# Patient Record
Sex: Female | Born: 1990 | Race: Black or African American | Hispanic: No | Marital: Married | Smoking: Never smoker
Health system: Southern US, Community
[De-identification: ages and names within clinical notes are randomized; demographics above are authoritative.]

## PROBLEM LIST (undated history)

## (undated) DIAGNOSIS — D219 Benign neoplasm of connective and other soft tissue, unspecified: Secondary | ICD-10-CM

## (undated) DIAGNOSIS — I959 Hypotension, unspecified: Secondary | ICD-10-CM

## (undated) DIAGNOSIS — H9192 Unspecified hearing loss, left ear: Secondary | ICD-10-CM

## (undated) HISTORY — DX: Benign neoplasm of connective and other soft tissue, unspecified: D21.9

## (undated) HISTORY — PX: MYOMECTOMY: SHX85

## (undated) HISTORY — PX: CYSTECTOMY: SHX5119

## (undated) HISTORY — PX: OTHER SURGICAL HISTORY: SHX169

---
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
## 2008-04-28 ENCOUNTER — Inpatient Hospital Stay (HOSPITAL_COMMUNITY): Admission: AD | Admit: 2008-04-28 | Discharge: 2008-04-28 | Payer: Self-pay | Admitting: Obstetrics & Gynecology

## 2008-07-18 ENCOUNTER — Inpatient Hospital Stay (HOSPITAL_COMMUNITY): Admission: AD | Admit: 2008-07-18 | Discharge: 2008-07-18 | Payer: Self-pay | Admitting: Family Medicine

## 2008-11-22 ENCOUNTER — Emergency Department (HOSPITAL_COMMUNITY): Admission: EM | Admit: 2008-11-22 | Discharge: 2008-11-22 | Payer: Self-pay | Admitting: Emergency Medicine

## 2009-01-09 ENCOUNTER — Ambulatory Visit: Payer: Self-pay | Admitting: Physician Assistant

## 2009-01-09 ENCOUNTER — Inpatient Hospital Stay (HOSPITAL_COMMUNITY): Admission: AD | Admit: 2009-01-09 | Discharge: 2009-01-09 | Payer: Self-pay | Admitting: Obstetrics & Gynecology

## 2010-03-30 ENCOUNTER — Emergency Department (HOSPITAL_COMMUNITY): Admission: EM | Admit: 2010-03-30 | Discharge: 2010-03-30 | Payer: Self-pay | Admitting: Emergency Medicine

## 2010-05-31 ENCOUNTER — Emergency Department (HOSPITAL_COMMUNITY): Admission: EM | Admit: 2010-05-31 | Discharge: 2010-05-31 | Payer: Self-pay | Admitting: Family Medicine

## 2010-06-02 ENCOUNTER — Emergency Department (HOSPITAL_COMMUNITY): Admission: EM | Admit: 2010-06-02 | Discharge: 2010-06-02 | Payer: Self-pay | Admitting: Emergency Medicine

## 2010-10-21 LAB — URINALYSIS, ROUTINE W REFLEX MICROSCOPIC
Bilirubin Urine: NEGATIVE
Glucose, UA: NEGATIVE mg/dL
Ketones, ur: 15 mg/dL — AB

## 2010-10-21 LAB — URINE CULTURE
Colony Count: >=100000
Colony Count: NO GROWTH
Culture  Setup Time: 201110232358

## 2010-10-21 LAB — URINE MICROSCOPIC-ADD ON

## 2010-10-21 LAB — POCT I-STAT, CHEM 8
HCT: 39 % (ref 36.0–46.0)
Hemoglobin: 13.3 g/dL (ref 12.0–15.0)
Potassium: 3.9 mEq/L (ref 3.5–5.1)
Sodium: 139 mEq/L (ref 135–145)
TCO2: 24 mmol/L (ref 0–100)

## 2010-10-21 LAB — POCT URINALYSIS DIPSTICK
Glucose, UA: 100 mg/dL — AB
Ketones, ur: NEGATIVE mg/dL

## 2010-10-23 LAB — URINALYSIS, ROUTINE W REFLEX MICROSCOPIC
Bilirubin Urine: NEGATIVE
Hgb urine dipstick: NEGATIVE
Nitrite: NEGATIVE
Protein, ur: NEGATIVE mg/dL
Urobilinogen, UA: 1 mg/dL (ref 0.0–1.0)

## 2010-10-23 LAB — DIFFERENTIAL
Eosinophils Absolute: 0.3 10*3/uL (ref 0.0–0.7)
Eosinophils Relative: 3 % (ref 0–5)
Lymphocytes Relative: 18 % (ref 12–46)
Lymphs Abs: 1.6 10*3/uL (ref 0.7–4.0)
Neutrophils Relative %: 74 % (ref 43–77)

## 2010-10-23 LAB — POCT I-STAT, CHEM 8
BUN: 9 mg/dL (ref 6–23)
Calcium, Ion: 1.11 mmol/L — ABNORMAL LOW (ref 1.12–1.32)
Chloride: 105 mEq/L (ref 96–112)
Creatinine, Ser: 0.8 mg/dL (ref 0.4–1.2)
Hemoglobin: 14.3 g/dL (ref 12.0–15.0)
Sodium: 138 mEq/L (ref 135–145)

## 2010-10-23 LAB — CBC
HCT: 37.5 % (ref 36.0–46.0)
Hemoglobin: 13.3 g/dL (ref 12.0–15.0)
Platelets: 218 10*3/uL (ref 150–400)
RBC: 4.03 MIL/uL (ref 3.87–5.11)

## 2010-10-23 LAB — POCT PREGNANCY, URINE: Preg Test, Ur: NEGATIVE

## 2010-11-16 LAB — URINE MICROSCOPIC-ADD ON

## 2010-11-16 LAB — URINALYSIS, ROUTINE W REFLEX MICROSCOPIC
Protein, ur: NEGATIVE mg/dL
Urobilinogen, UA: 0.2 mg/dL (ref 0.0–1.0)

## 2010-11-16 LAB — GC/CHLAMYDIA PROBE AMP, GENITAL: Chlamydia, DNA Probe: NEGATIVE

## 2010-11-16 LAB — URINE CULTURE: Colony Count: 50000

## 2011-05-10 LAB — URINALYSIS, ROUTINE W REFLEX MICROSCOPIC
Ketones, ur: NEGATIVE
Protein, ur: NEGATIVE
Specific Gravity, Urine: 1.015
pH: 8

## 2011-05-10 LAB — URINE MICROSCOPIC-ADD ON

## 2011-05-10 LAB — RPR: RPR Ser Ql: NONREACTIVE

## 2011-05-10 LAB — WET PREP, GENITAL: Clue Cells Wet Prep HPF POC: NONE SEEN

## 2011-05-14 LAB — URINALYSIS, ROUTINE W REFLEX MICROSCOPIC
Ketones, ur: NEGATIVE mg/dL
Leukocytes, UA: NEGATIVE
Nitrite: NEGATIVE
Protein, ur: NEGATIVE mg/dL

## 2011-05-14 LAB — POCT PREGNANCY, URINE: Preg Test, Ur: NEGATIVE

## 2011-05-14 LAB — GC/CHLAMYDIA PROBE AMP, GENITAL: GC Probe Amp, Genital: NEGATIVE

## 2011-05-14 LAB — WET PREP, GENITAL
Trich, Wet Prep: NONE SEEN
Yeast Wet Prep HPF POC: NONE SEEN

## 2013-12-10 DIAGNOSIS — D259 Leiomyoma of uterus, unspecified: Secondary | ICD-10-CM | POA: Insufficient documentation

## 2013-12-10 HISTORY — DX: Leiomyoma of uterus, unspecified: D25.9

## 2013-12-23 ENCOUNTER — Encounter (HOSPITAL_COMMUNITY): Payer: Self-pay | Admitting: Emergency Medicine

## 2013-12-23 ENCOUNTER — Emergency Department (HOSPITAL_COMMUNITY): Payer: BC Managed Care – PPO

## 2013-12-23 ENCOUNTER — Emergency Department (HOSPITAL_COMMUNITY)
Admission: EM | Admit: 2013-12-23 | Discharge: 2013-12-23 | Disposition: A | Discharge status: Home / Self Care | Payer: BC Managed Care – PPO | Attending: Emergency Medicine | Admitting: Emergency Medicine

## 2013-12-23 DIAGNOSIS — Z880 Allergy status to penicillin: Secondary | ICD-10-CM | POA: Insufficient documentation

## 2013-12-23 DIAGNOSIS — O9989 Other specified diseases and conditions complicating pregnancy, childbirth and the puerperium: Secondary | ICD-10-CM | POA: Insufficient documentation

## 2013-12-23 DIAGNOSIS — R110 Nausea: Secondary | ICD-10-CM | POA: Insufficient documentation

## 2013-12-23 DIAGNOSIS — O039 Complete or unspecified spontaneous abortion without complication: Secondary | ICD-10-CM

## 2013-12-23 DIAGNOSIS — N939 Abnormal uterine and vaginal bleeding, unspecified: Secondary | ICD-10-CM

## 2013-12-23 LAB — COMPREHENSIVE METABOLIC PANEL WITH GFR
ALT: 13 U/L (ref 0–35)
AST: 22 U/L (ref 0–37)
Albumin: 4.1 g/dL (ref 3.5–5.2)
Alkaline Phosphatase: 59 U/L (ref 39–117)
BUN: 7 mg/dL (ref 6–23)
CO2: 23 meq/L (ref 19–32)
Calcium: 9.4 mg/dL (ref 8.4–10.5)
Chloride: 106 meq/L (ref 96–112)
Creatinine, Ser: 0.86 mg/dL (ref 0.50–1.10)
GFR calc Af Amer: >90 mL/min
GFR calc non Af Amer: >90 mL/min
Glucose, Bld: 91 mg/dL (ref 70–99)
Potassium: 4.2 meq/L (ref 3.7–5.3)
Sodium: 141 meq/L (ref 137–147)
Total Bilirubin: <0.2 mg/dL — ABNORMAL LOW (ref 0.3–1.2)
Total Protein: 7.3 g/dL (ref 6.0–8.3)

## 2013-12-23 LAB — POC URINE PREG, ED: Preg Test, Ur: POSITIVE — AB

## 2013-12-23 LAB — CBC WITH DIFFERENTIAL/PLATELET
BASOS ABS: 0 10*3/uL (ref 0.0–0.1)
Basophils Relative: 0 % (ref 0–1)
EOS ABS: 0.4 10*3/uL (ref 0.0–0.7)
EOS PCT: 4 % (ref 0–5)
HCT: 37.6 % (ref 36.0–46.0)
Hemoglobin: 12.7 g/dL (ref 12.0–15.0)
Lymphocytes Relative: 40 % (ref 12–46)
Lymphs Abs: 3.2 10*3/uL (ref 0.7–4.0)
MCH: 32.3 pg (ref 26.0–34.0)
MCHC: 33.8 g/dL (ref 30.0–36.0)
MCV: 95.7 fL (ref 78.0–100.0)
Monocytes Absolute: 0.5 10*3/uL (ref 0.1–1.0)
Monocytes Relative: 6 % (ref 3–12)
Neutro Abs: 4 10*3/uL (ref 1.7–7.7)
Neutrophils Relative %: 50 % (ref 43–77)
PLATELETS: 269 10*3/uL (ref 150–400)
RBC: 3.93 MIL/uL (ref 3.87–5.11)
RDW: 13.1 % (ref 11.5–15.5)
WBC: 8.1 10*3/uL (ref 4.0–10.5)

## 2013-12-23 LAB — URINALYSIS, ROUTINE W REFLEX MICROSCOPIC
Bilirubin Urine: NEGATIVE
Glucose, UA: NEGATIVE mg/dL
KETONES UR: NEGATIVE mg/dL
NITRITE: NEGATIVE
PROTEIN: 100 mg/dL — AB
Specific Gravity, Urine: 1.021 (ref 1.005–1.030)
UROBILINOGEN UA: 0.2 mg/dL (ref 0.0–1.0)
pH: 5.5 (ref 5.0–8.0)

## 2013-12-23 LAB — WET PREP, GENITAL
TRICH WET PREP: NONE SEEN
Yeast Wet Prep HPF POC: NONE SEEN

## 2013-12-23 LAB — URINE MICROSCOPIC-ADD ON

## 2013-12-23 LAB — ABO/RH: ABO/RH(D): B POS

## 2013-12-23 LAB — HCG, QUANTITATIVE, PREGNANCY: HCG, BETA CHAIN, QUANT, S: 212 m[IU]/mL — AB (ref <5)

## 2013-12-23 LAB — LIPASE, BLOOD: Lipase: 29 U/L (ref 11–59)

## 2013-12-23 MED ORDER — ONDANSETRON HCL 4 MG/2ML IJ SOLN
4.0000 mg | Freq: Once | INTRAMUSCULAR | Status: AC
  Administered 2013-12-23: 4 mg via INTRAVENOUS
  Filled 2013-12-23: qty 2

## 2013-12-23 MED ORDER — METRONIDAZOLE 500 MG PO TABS: 500.0000 mg | ORAL_TABLET | Freq: Two times a day (BID) | ORAL | Status: DC

## 2013-12-23 MED ORDER — MORPHINE SULFATE 4 MG/ML IJ SOLN
4.0000 mg | Freq: Once | INTRAMUSCULAR | Status: AC
  Administered 2013-12-23: 4 mg via INTRAVENOUS
  Filled 2013-12-23: qty 1

## 2013-12-23 NOTE — ED Notes (Signed)
Pt reports generalized abdominal cramping x 2 hours with nausea. Pt states taking pills on Monday to induce spontaneous abortion and since then has had moderate vaginal bleeding; initially dark red but now is bright red. Denies lightheadedness/dizziness. NAD.

## 2013-12-23 NOTE — ED Notes (Signed)
This RN into start IV on pt, unable to find vein, pt not stuck.  Jody, RN at bedside to attempt ultrasound IV .

## 2013-12-23 NOTE — ED Provider Notes (Signed)
CSN: [id number]     Arrival date & time 12/23/13  1523 History   First MD Initiated Contact with Patient 12/23/13 1931     Chief Complaint  Patient presents with  . Abdominal Cramping     (Consider location/radiation/quality/duration/timing/severity/associated sxs/prior Treatment) HPI Comments: Patient reports generalized abdominal cramping with nausea and vaginal bleeding onset 5 days ago after taking some medication to induce an abortion. This is given to her by her gynecologist Dr. Rogue Bussing. She reports being about one month pregnant at the time. She states the bleeding subsided but then worsened today to the point of 2 pads per hour. She denies any dizziness or lightheadedness. She denies any chest pain or shortness of breath. She endorses diffuse abdominal cramping with nausea but no vomiting. No change in bowel habits. This is her first pregnancy.  The history is provided by the patient.    History reviewed. No pertinent past medical history. History reviewed. No pertinent past surgical history. History reviewed. No pertinent family history. History  Substance Use Topics  . Smoking status: Never Smoker   . Smokeless tobacco: Not on file  . Alcohol Use: Yes     Comment: occasionally    OB History   Grav Para Term Preterm Abortions TAB SAB Ect Mult Living   1    1     0     Review of Systems  Constitutional: Negative for fever, activity change, appetite change and fatigue.  Respiratory: Negative for cough, chest tightness and shortness of breath.   Cardiovascular: Negative for chest pain.  Gastrointestinal: Positive for nausea and abdominal pain. Negative for vomiting.  Genitourinary: Positive for vaginal bleeding. Negative for dysuria.  Musculoskeletal: Negative for arthralgias and myalgias.  Skin: Negative for rash.  Neurological: Negative for dizziness and weakness.  A complete 10 system review of systems was obtained and all systems are negative except as noted in the  HPI and PMH.      Allergies  Penicillins  Home Medications   Prior to Admission medications   Medication Sig Start Date End Date Taking? Authorizing Provider  ibuprofen (ADVIL,MOTRIN) 200 MG tablet Take 800 mg by mouth every 6 (six) hours as needed for moderate pain.   Yes Historical Provider, MD   BP 116/70  Pulse 67  Temp(Src) 98.4 F (36.9 C) (Oral)  Resp 16  Ht 5\' 7"  (1.702 m)  Wt 218 lb 5 oz (99.026 kg)  BMI 34.18 kg/m2  SpO2 100%  LMP 12/23/2013 Physical Exam  Constitutional: She is oriented to person, place, and time. She appears well-developed and well-nourished. No distress.  HENT:  Head: Normocephalic and atraumatic.  Mouth/Throat: No oropharyngeal exudate.  Eyes: Conjunctivae and EOM are normal. Pupils are equal, round, and reactive to light.  Neck: Normal range of motion. Neck supple.  Cardiovascular: Normal rate, regular rhythm and normal heart sounds.   Pulmonary/Chest: Effort normal and breath sounds normal. No respiratory distress.  Abdominal: Soft. There is tenderness. There is no rebound.  Diffuse abdominal tenderness without peritoneal signs  Genitourinary:  Chaperone present. Normal external genitalia. Cervix is closed Dark blood in vaginal vault. No active bleeding. No CMT or adnexal tenderness  Musculoskeletal: Normal range of motion. She exhibits no edema and no tenderness.  No CVA tenderness  Neurological: She is alert and oriented to person, place, and time. No cranial nerve deficit. She exhibits normal muscle tone. Coordination normal.  Skin: Skin is warm.    ED Course  Procedures (including critical care time) Labs  Review Labs Reviewed  WET PREP, GENITAL - Abnormal; Notable for the following:    Clue Cells Wet Prep HPF POC FEW (*)    WBC, Wet Prep HPF POC FEW (*)    All other components within normal limits  COMPREHENSIVE METABOLIC PANEL - Abnormal; Notable for the following:    Total Bilirubin <0.2 (*)    All other components within  normal limits  URINALYSIS, ROUTINE W REFLEX MICROSCOPIC - Abnormal; Notable for the following:    Color, Urine RED (*)    APPearance CLOUDY (*)    Hgb urine dipstick LARGE (*)    Protein, ur 100 (*)    Leukocytes, UA SMALL (*)    All other components within normal limits  URINE MICROSCOPIC-ADD ON - Abnormal; Notable for the following:    Squamous Epithelial / LPF MANY (*)    Bacteria, UA MANY (*)    All other components within normal limits  HCG, QUANTITATIVE, PREGNANCY - Abnormal; Notable for the following:    hCG, Beta Chain, Quant, S 212 (*)    All other components within normal limits  POC URINE PREG, ED - Abnormal; Notable for the following:    Preg Test, Ur POSITIVE (*)    All other components within normal limits  GC/CHLAMYDIA PROBE AMP  LIPASE, BLOOD  CBC WITH DIFFERENTIAL  ABO/RH    Imaging Review US Ob Comp Less 14 Wks  12/23/2013   CLINICAL DATA:  Elective abortion meds taken on Dec 17, 2013. Vaginal bleeding and abdominal cramps.  EXAM: OBSTETRIC <14 WK ULTRASOUND  TECHNIQUE: Transabdominal ultrasound was performed for evaluation of the gestation as well as the maternal uterus and adnexal regions.  COMPARISON:  None.  FINDINGS: Intrauterine gestational sac: Not present  Yolk sac:  Not present  Embryo:  Not present  Cardiac Activity: Not present  Heart Rate: Not press bpm  Maternal uterus/adnexae: The bilateral ovaries are normal. The uterus measures 8.5 x 3.8 x 4.4 cm. There is a pedunculated fibroid off of the left uterus measuring 5.3 x 4.3 x 6.1 cm. The endometrium is homogeneous but thickened measuring 13 mm.  IMPRESSION: There is no intrauterine gestational sac or gestational identified consistent with patient's known abortion.  Uterine fibroid.  Homogeneous thickened endometrium measuring 13 mm.   Electronically Signed   By: Abelardo Diesel M.D.   On: 12/23/2013 21:19   US Ob Transvaginal  12/23/2013   CLINICAL DATA:  Elective abortion meds taken on Dec 17, 2013. Vaginal  bleeding and abdominal cramps.  EXAM: OBSTETRIC <14 WK ULTRASOUND  TECHNIQUE: Transabdominal ultrasound was performed for evaluation of the gestation as well as the maternal uterus and adnexal regions.  COMPARISON:  None.  FINDINGS: Intrauterine gestational sac: Not present  Yolk sac:  Not present  Embryo:  Not present  Cardiac Activity: Not present  Heart Rate: Not press bpm  Maternal uterus/adnexae: The bilateral ovaries are normal. The uterus measures 8.5 x 3.8 x 4.4 cm. There is a pedunculated fibroid off of the left uterus measuring 5.3 x 4.3 x 6.1 cm. The endometrium is homogeneous but thickened measuring 13 mm.  IMPRESSION: There is no intrauterine gestational sac or gestational identified consistent with patient's known abortion.  Uterine fibroid.  Homogeneous thickened endometrium measuring 13 mm.   Electronically Signed   By: Abelardo Diesel M.D.   On: 12/23/2013 21:19     EKG Interpretation None      MDM   Final diagnoses:  Vaginal bleeding  Complete miscarriage  Vaginal bleeding with abdominal cramping after induced abortion. Vital stable. No distress. Abdomen without peritoneal signs. Pelvic exam benign.  Hemoglobin stable. No tachycardia. Orthostatics negative.  Ultrasound shows no intra-abdominal pregnancy or products of conception.  Patient is hemodynamically stable. No vomiting. Orthostatics negative. We'll treat bacterial vaginosis. She stable for followup with her gynecologist. Return precautions discussed including worsening bleeding, dizziness, lightheadedness, chest pain, shortness of breath or any other concerns.  BP 116/70  Pulse 67  Temp(Src) 98.4 F (36.9 C) (Oral)  Resp 16  Ht 5\' 7"  (1.702 m)  Wt 218 lb 5 oz (99.026 kg)  BMI 34.18 kg/m2  SpO2 100%  LMP 12/23/2013   Ezequiel Essex, MD 12/24/13 0222

## 2013-12-23 NOTE — Discharge Instructions (Signed)
Miscarriage Follow up with Dr. Rogue Bussing this week. Return to the ED if you develop worsening bleeding, weakness, dizziness, chest pain, shortness of breath or any other concerns. A miscarriage is the sudden loss of an unborn baby (fetus) before the 20th week of pregnancy. Most miscarriages happen in the first 3 months of pregnancy. Sometimes, it happens before a woman even knows she is pregnant. A miscarriage is also called a "spontaneous miscarriage" or "early pregnancy loss." Having a miscarriage can be an emotional experience. Talk with your caregiver about any questions you may have about miscarrying, the grieving process, and your future pregnancy plans. CAUSES   Problems with the fetal chromosomes that make it impossible for the baby to develop normally. Problems with the baby's genes or chromosomes are most often the result of errors that occur, by chance, as the embryo divides and grows. The problems are not inherited from the parents.  Infection of the cervix or uterus.   Hormone problems.   Problems with the cervix, such as having an incompetent cervix. This is when the tissue in the cervix is not strong enough to hold the pregnancy.   Problems with the uterus, such as an abnormally shaped uterus, uterine fibroids, or congenital abnormalities.   Certain medical conditions.   Smoking, drinking alcohol, or taking illegal drugs.   Trauma.  Often, the cause of a miscarriage is unknown.  SYMPTOMS   Vaginal bleeding or spotting, with or without cramps or pain.  Pain or cramping in the abdomen or lower back.  Passing fluid, tissue, or blood clots from the vagina. DIAGNOSIS  Your caregiver will perform a physical exam. You may also have an ultrasound to confirm the miscarriage. Blood or urine tests may also be ordered. TREATMENT   Sometimes, treatment is not necessary if you naturally pass all the fetal tissue that was in the uterus. If some of the fetus or placenta remains  in the body (incomplete miscarriage), tissue left behind may become infected and must be removed. Usually, a dilation and curettage (D and C) procedure is performed. During a D and C procedure, the cervix is widened (dilated) and any remaining fetal or placental tissue is gently removed from the uterus.  Antibiotic medicines are prescribed if there is an infection. Other medicines may be given to reduce the size of the uterus (contract) if there is a lot of bleeding.  If you have Rh negative blood and your baby was Rh positive, you will need a Rh immunoglobulin shot. This shot will protect any future baby from having Rh blood problems in future pregnancies. HOME CARE INSTRUCTIONS   Your caregiver may order bed rest or may allow you to continue light activity. Resume activity as directed by your caregiver.  Have someone help with home and family responsibilities during this time.   Keep track of the number of sanitary pads you use each day and how soaked (saturated) they are. Write down this information.   Do not use tampons. Do not douche or have sexual intercourse until approved by your caregiver.   Only take over-the-counter or prescription medicines for pain or discomfort as directed by your caregiver.   Do not take aspirin. Aspirin can cause bleeding.   Keep all follow-up appointments with your caregiver.   If you or your partner have problems with grieving, talk to your caregiver or seek counseling to help cope with the pregnancy loss. Allow enough time to grieve before trying to get pregnant again.  [facility]  CARE IF:   You have severe cramps or pain in your back or abdomen.  You have a fever.  You pass large blood clots (walnut-sized or larger) ortissue from your vagina. Save any tissue for your caregiver to inspect.   Your bleeding increases.   You have a thick, bad-smelling vaginal discharge.  You become lightheaded, weak, or you faint.   You have  chills.  MAKE SURE YOU:  Understand these instructions.  Will watch your condition.  Will get help right away if you are not doing well or get worse. Document Released: 01/19/2001 Document Revised: 11/20/2012 Document Reviewed: 09/14/2011 [facility] Patient Information 2014 [place].

## 2013-12-23 NOTE — ED Notes (Signed)
Patient transported to Ultrasound 

## 2013-12-24 LAB — GC/CHLAMYDIA PROBE AMP
CT Probe RNA: NEGATIVE
GC Probe RNA: NEGATIVE

## 2014-06-10 ENCOUNTER — Encounter (HOSPITAL_COMMUNITY): Payer: Self-pay | Admitting: Emergency Medicine

## 2018-10-16 ENCOUNTER — Ambulatory Visit (INDEPENDENT_AMBULATORY_CARE_PROVIDER_SITE_OTHER): Payer: 59 | Admitting: Obstetrics & Gynecology

## 2018-10-16 ENCOUNTER — Encounter: Payer: Self-pay | Admitting: Obstetrics & Gynecology

## 2018-10-16 ENCOUNTER — Other Ambulatory Visit (HOSPITAL_COMMUNITY)
Admission: RE | Admit: 2018-10-16 | Discharge: 2018-10-16 | Disposition: A | Discharge status: Home / Self Care | Payer: 59 | Source: Ambulatory Visit | Attending: Obstetrics & Gynecology | Admitting: Obstetrics & Gynecology

## 2018-10-16 VITALS — BP 110/70 | Wt 188.0 lb

## 2018-10-16 DIAGNOSIS — O09291 Supervision of pregnancy with other poor reproductive or obstetric history, first trimester: Secondary | ICD-10-CM

## 2018-10-16 DIAGNOSIS — Z8759 Personal history of other complications of pregnancy, childbirth and the puerperium: Secondary | ICD-10-CM

## 2018-10-16 DIAGNOSIS — Z124 Encounter for screening for malignant neoplasm of cervix: Secondary | ICD-10-CM

## 2018-10-16 DIAGNOSIS — Z3201 Encounter for pregnancy test, result positive: Secondary | ICD-10-CM

## 2018-10-16 DIAGNOSIS — Z3A01 Less than 8 weeks gestation of pregnancy: Secondary | ICD-10-CM

## 2018-10-16 HISTORY — DX: Personal history of other complications of pregnancy, childbirth and the puerperium: Z87.59

## 2018-10-16 LAB — POCT URINALYSIS DIPSTICK OB
Glucose, UA: NEGATIVE
PROTEIN: NEGATIVE

## 2018-10-16 LAB — POCT URINE PREGNANCY: Preg Test, Ur: POSITIVE — AB

## 2018-10-16 MED ORDER — CONCEPT OB 130-92.4-1 MG PO CAPS: 1.0000 | ORAL_CAPSULE | Freq: Every day | ORAL | 11 refills | Status: DC

## 2018-10-16 NOTE — Patient Instructions (Signed)
First Trimester of Pregnancy  The first trimester of pregnancy is from week 1 until the end of week 13 (months 1 through 3). A week after a sperm fertilizes an egg, the egg will implant on the wall of the uterus. This embryo will begin to develop into a baby. Genes from you and your partner will form the baby. The female genes will determine whether the baby will be a boy or a girl. At 6-8 weeks, the eyes and face will be formed, and the heartbeat can be seen on ultrasound. At the end of 12 weeks, all the baby's organs will be formed.  Now that you are pregnant, you will want to do everything you can to have a healthy baby. Two of the most important things are to get good prenatal care and to follow your health care provider's instructions. Prenatal care is all the medical care you receive before the baby's birth. This care will help prevent, find, and treat any problems during the pregnancy and childbirth.  Body changes during your first trimester  Your body goes through many changes during pregnancy. The changes vary from woman to woman.   You may gain or lose a couple of pounds at first.   You may feel sick to your stomach (nauseous) and you may throw up (vomit). If the vomiting is uncontrollable, call your health care provider.   You may tire easily.   You may develop headaches that can be relieved by medicines. All medicines should be approved by your health care provider.   You may urinate more often. Painful urination may mean you have a bladder infection.   You may develop heartburn as a result of your pregnancy.   You may develop constipation because certain hormones are causing the muscles that push stool through your intestines to slow down.   You may develop hemorrhoids or swollen veins (varicose veins).   Your breasts may begin to grow larger and become tender. Your nipples may stick out more, and the tissue that surrounds them (areola) may become darker.   Your gums may bleed and may be  sensitive to brushing and flossing.   Dark spots or blotches (chloasma, mask of pregnancy) may develop on your face. This will likely fade after the baby is born.   Your menstrual periods will stop.   You may have a loss of appetite.   You may develop cravings for certain kinds of food.   You may have changes in your emotions from day to day, such as being excited to be pregnant or being concerned that something may go wrong with the pregnancy and baby.   You may have more vivid and strange dreams.   You may have changes in your hair. These can include thickening of your hair, rapid growth, and changes in texture. Some women also have hair loss during or after pregnancy, or hair that feels dry or thin. Your hair will most likely return to normal after your baby is born.  What to expect at prenatal visits  During a routine prenatal visit:   You will be weighed to make sure you and the baby are growing normally.   Your blood pressure will be taken.   Your abdomen will be measured to track your baby's growth.   The fetal heartbeat will be listened to between weeks 10 and 14 of your pregnancy.   Test results from any previous visits will be discussed.  Your health care provider may ask you:     How you are feeling.   If you are feeling the baby move.   If you have had any abnormal symptoms, such as leaking fluid, bleeding, severe headaches, or abdominal cramping.   If you are using any tobacco products, including cigarettes, chewing tobacco, and electronic cigarettes.   If you have any questions.  Other tests that may be performed during your first trimester include:   Blood tests to find your blood type and to check for the presence of any previous infections. The tests will also be used to check for low iron levels (anemia) and protein on red blood cells (Rh antibodies). Depending on your risk factors, or if you previously had diabetes during pregnancy, you may have tests to check for high blood sugar  that affects pregnant women (gestational diabetes).   Urine tests to check for infections, diabetes, or protein in the urine.   An ultrasound to confirm the proper growth and development of the baby.   Fetal screens for spinal cord problems (spina bifida) and Down syndrome.   HIV (human immunodeficiency virus) testing. Routine prenatal testing includes screening for HIV, unless you choose not to have this test.   You may need other tests to make sure you and the baby are doing well.  Follow these instructions at home:  Medicines   Follow your health care provider's instructions regarding medicine use. Specific medicines may be either safe or unsafe to take during pregnancy.   Take a prenatal vitamin that contains at least 600 micrograms (mcg) of folic acid.   If you develop constipation, try taking a stool softener if your health care provider approves.  Eating and drinking     Eat a balanced diet that includes fresh fruits and vegetables, whole grains, good sources of protein such as meat, eggs, or tofu, and low-fat dairy. Your health care provider will help you determine the amount of weight gain that is right for you.   Avoid raw meat and uncooked cheese. These carry germs that can cause birth defects in the baby.   Eating four or five small meals rather than three large meals a day may help relieve nausea and vomiting. If you start to feel nauseous, eating a few soda crackers can be helpful. Drinking liquids between meals, instead of during meals, also seems to help ease nausea and vomiting.   Limit foods that are high in fat and processed sugars, such as fried and sweet foods.   To prevent constipation:  ? Eat foods that are high in fiber, such as fresh fruits and vegetables, whole grains, and beans.  ? Drink enough fluid to keep your urine clear or pale yellow.  Activity   Exercise only as directed by your health care provider. Most women can continue their usual exercise routine during  pregnancy. Try to exercise for 30 minutes at least 5 days a week. Exercising will help you:  ? Control your weight.  ? Stay in shape.  ? Be prepared for labor and delivery.   Experiencing pain or cramping in the lower abdomen or lower back is a good sign that you should stop exercising. Check with your health care provider before continuing with normal exercises.   Try to avoid standing for long periods of time. Move your legs often if you must stand in one place for a long time.   Avoid heavy lifting.   Wear low-heeled shoes and practice good posture.   You may continue to have sex unless your health care   provider tells you not to.  Relieving pain and discomfort   Wear a good support bra to relieve breast tenderness.   Take warm sitz baths to soothe any pain or discomfort caused by hemorrhoids. Use hemorrhoid cream if your health care provider approves.   Rest with your legs elevated if you have leg cramps or low back pain.   If you develop varicose veins in your legs, wear support hose. Elevate your feet for 15 minutes, 3-4 times a day. Limit salt in your diet.  Prenatal care   Schedule your prenatal visits by the twelfth week of pregnancy. They are usually scheduled monthly at first, then more often in the last 2 months before delivery.   Write down your questions. Take them to your prenatal visits.   Keep all your prenatal visits as told by your health care provider. This is important.  Safety   Wear your seat belt at all times when driving.   Make a list of emergency phone numbers, including numbers for family, friends, the hospital, and police and fire departments.  General instructions   Ask your health care provider for a referral to a local prenatal education class. Begin classes no later than the beginning of month 6 of your pregnancy.   Ask for help if you have counseling or nutritional needs during pregnancy. Your health care provider can offer advice or refer you to specialists for help  with various needs.   Do not use hot tubs, steam rooms, or saunas.   Do not douche or use tampons or scented sanitary pads.   Do not cross your legs for long periods of time.   Avoid cat litter boxes and soil used by cats. These carry germs that can cause birth defects in the baby and possibly loss of the fetus by miscarriage or stillbirth.   Avoid all smoking, herbs, alcohol, and medicines not prescribed by your health care provider. Chemicals in these products affect the formation and growth of the baby.   Do not use any products that contain nicotine or tobacco, such as cigarettes and e-cigarettes. If you need help quitting, ask your health care provider. You may receive counseling support and other resources to help you quit.   Schedule a dentist appointment. At home, brush your teeth with a soft toothbrush and be gentle when you floss.  Contact a health care provider if:   You have dizziness.   You have mild pelvic cramps, pelvic pressure, or nagging pain in the abdominal area.   You have persistent nausea, vomiting, or diarrhea.   You have a bad smelling vaginal discharge.   You have pain when you urinate.   You notice increased swelling in your face, hands, legs, or ankles.   You are exposed to fifth disease or chickenpox.   You are exposed to German measles (rubella) and have never had it.  Get help right away if:   You have a fever.   You are leaking fluid from your vagina.   You have spotting or bleeding from your vagina.   You have severe abdominal cramping or pain.   You have rapid weight gain or loss.   You vomit blood or material that looks like coffee grounds.   You develop a severe headache.   You have shortness of breath.   You have any kind of trauma, such as from a fall or a car accident.  Summary   The first trimester of pregnancy is from week 1 until   the end of week 13 (months 1 through 3).   Your body goes through many changes during pregnancy. The changes vary from  woman to woman.   You will have routine prenatal visits. During those visits, your health care provider will examine you, discuss any test results you may have, and talk with you about how you are feeling.  This information is not intended to replace advice given to you by your health care provider. Make sure you discuss any questions you have with your health care provider.  Document Released: 07/20/2001 Document Revised: 07/07/2016 Document Reviewed: 07/07/2016  [employer] Interactive Patient Education  2019 [employer]

## 2018-10-16 NOTE — Progress Notes (Signed)
10/16/2018   Chief Complaint: Missed period  Transfer of Care Patient: no  History of Present Illness: [name] is a 28 y.o. G2P0010 [redacted]w[redacted]d based on Patient's last menstrual period was 09/14/2018. with an Estimated Date of Delivery: 06/21/19, with the above CC.   Her periods were: regular periods every 28 days She was using no method when she conceived.  She has Negative signs or symptoms of nausea/vomiting of pregnancy. She has Negative signs or symptoms of miscarriage or preterm labor She identifies Negative Zika risk factors for her and her partner On any different medications around the time she conceived/early pregnancy: No  History of varicella: Yes   ROS: A 12-point review of systems was performed and negative, except as stated in the above HPI.  OBGYN History: As per HPI. OB History  Gravida Para Term Preterm AB Living  2       1 0  SAB TAB Ectopic Multiple Live Births      1        # Outcome Date GA Lbr Len/2nd Weight Sex Delivery Anes PTL Lv  2 Current           1 Ectopic             Any issues with any prior pregnancies: ECTOPIC 2015   Treated w MTX   Also, Myomectomy 2019 for large pedunculated fibroid (OK for vag delivery per pt from GYN in [place]\  Any prior children are healthy, doing well, without any problems or issues: no History of pap smears: Yes. Last pap smear 1 year ago. Abnormal: no  History of STIs: No   Past Medical History: Past Medical History:  Diagnosis Date  . Fibroids     Past Surgical History: Past Surgical History:  Procedure Laterality Date  . CYSTECTOMY    . MYOMECTOMY      Family History:  Family History  Problem Relation Age of Onset  . Breast cancer Maternal Grandmother   . Diabetes Maternal Grandfather   . Hypertension Paternal Grandfather   . Breast cancer Paternal Aunt   . Breast cancer Paternal Aunt    She denies any female cancers, bleeding or blood clotting disorders.  She denies any history of mental  retardation, birth defects or genetic disorders in her or the FOB's history  Social History:  Social History   Socioeconomic History  . Marital status: Single    Spouse name: Not on file  . Number of children: Not on file  . Years of education: Not on file  . Highest education level: Not on file  Occupational History  . Not on file  Social Needs  . Financial resource strain: Not on file  . Food insecurity:    Worry: Not on file    Inability: Not on file  . Transportation needs:    Medical: Not on file    Non-medical: Not on file  Tobacco Use  . Smoking status: Never Smoker  . Smokeless tobacco: Never Used  Substance and Sexual Activity  . Alcohol use: Yes    Comment: occasionally   . Drug use: No  . Sexual activity: Yes    Birth control/protection: None  Lifestyle  . Physical activity:    Days per week: Not on file    Minutes per session: Not on file  . Stress: Not on file  Relationships  . Social connections:    Talks on phone: Not on file    Gets together: Not on file  Attends religious service: Not on file    Active member of club or organization: Not on file    Attends meetings of clubs or organizations: Not on file    Relationship status: Not on file  . Intimate partner violence:    Fear of current or ex partner: Not on file    Emotionally abused: Not on file    Physically abused: Not on file    Forced sexual activity: Not on file  Other Topics Concern  . Not on file  Social History Narrative  . Not on file   Any pets in the household: no  Allergy: Allergies  Allergen Reactions  . Penicillins Hives    Current Outpatient Medications:  Current Outpatient Medications:  .  ibuprofen (ADVIL,MOTRIN) 200 MG tablet, Take 800 mg by mouth every 6 (six) hours as needed for moderate pain., Disp: , Rfl:  .  metroNIDAZOLE (FLAGYL) 500 MG tablet, Take 1 tablet (500 mg total) by mouth 2 (two) times daily., Disp: 14 tablet, Rfl: 0 .  Prenat w/o A  Vit-FeFum-FePo-FA (CONCEPT OB) 130-92.4-1 MG CAPS, Take 1 capsule by mouth daily., Disp: 30 capsule, Rfl: 11   Physical Exam:   BP 110/70   Wt 188 lb (85.3 kg)   LMP 09/14/2018   BMI 29.44 kg/m  Body mass index is 29.44 kg/m. Constitutional: Well nourished, well developed female in no acute distress.  Neck:  Supple, normal appearance, and no thyromegaly  Cardiovascular: S1, S2 normal, no murmur, rub or gallop, regular rate and rhythm Respiratory:  Clear to auscultation bilateral. Normal respiratory effort Abdomen: positive bowel sounds and no masses, hernias; diffusely non tender to palpation, non distended Neuro/Psych:  Normal mood and affect.  Skin:  Warm and dry.  Lymphatic:  No inguinal lymphadenopathy.   Pelvic exam: is not limited by body habitus EGBUS: within normal limits, Vagina: within normal limits and with no blood in the vault, Cervix: normal appearing cervix without discharge or lesions, closed/long/high, Uterus:  nonenlarged, and Adnexa:  normal adnexa  Assessment: [name] is a 28 y.o. G2P0010 [redacted]w[redacted]d based on Patient's last menstrual period was 09/14/2018. with an Estimated Date of Delivery: 06/21/19,  for prenatal care.  Plan:  1) Avoid alcoholic beverages. 2) Patient encouraged not to smoke.  3) Discontinue the use of all non-medicinal drugs and chemicals.  4) Take prenatal vitamins daily.  5) Seatbelt use advised 6) Nutrition, food safety (fish, cheese advisories, and high nitrite foods) and exercise discussed. 7) Hospital and practice style delivering at [facility] discussed  8) Patient is asked about travel to areas at risk for the [place] virus, and counseled to avoid travel and exposure to mosquitoes or sexual partners who may have themselves been exposed to the virus. Testing is discussed, and will be ordered as appropriate.  9) Childbirth classes at [facility] advised 10) Genetic Screening, such as with 1st Trimester Screening, cell free fetal DNA, AFP testing, and  Ultrasound, as well as with amniocentesis and CVS as appropriate, is discussed with patient. She plans to consider genetic testing this pregnancy. 11) Beta levels x2 [place] 2 weeks unless needed prior Ectopic risks discussed Fibroid risks discussed Encouraged to stop smoking Husband- SS trait, but pt has had prior neg testing Problem list reviewed and updated.  Barnett Applebaum, MD, [facility], [facility] 10/16/2018  9:46 AM

## 2018-10-17 LAB — RPR+RH+ABO+RUB AB+AB SCR+CB...
Antibody Screen: NEGATIVE
HEMATOCRIT: 37.5 % (ref 34.0–46.6)
HIV Screen 4th Generation wRfx: NONREACTIVE
Hemoglobin: 13.2 g/dL (ref 11.1–15.9)
Hepatitis B Surface Ag: NEGATIVE
MCH: 32.2 pg (ref 26.6–33.0)
MCHC: 35.2 g/dL (ref 31.5–35.7)
MCV: 92 fL (ref 79–97)
Platelets: 308 10*3/uL (ref 150–450)
RBC: 4.1 x10E6/uL (ref 3.77–5.28)
RDW: 13 % (ref 11.7–15.4)
RH TYPE: POSITIVE
RPR Ser Ql: NONREACTIVE
Rubella Antibodies, IGG: 3.01 index (ref >0.99)
Varicella zoster IgG: >4000 index (ref >165)
WBC: 6.9 10*3/uL (ref 3.4–10.8)

## 2018-10-17 LAB — BETA HCG QUANT (REF LAB): hCG Quant: 2146 m[IU]/mL

## 2018-10-17 LAB — CYTOLOGY - PAP
CHLAMYDIA, DNA PROBE: NEGATIVE
Diagnosis: NEGATIVE
NEISSERIA GONORRHEA: NEGATIVE

## 2018-10-18 ENCOUNTER — Other Ambulatory Visit: Payer: 59

## 2018-10-18 ENCOUNTER — Other Ambulatory Visit: Payer: Self-pay

## 2018-10-18 DIAGNOSIS — Z8759 Personal history of other complications of pregnancy, childbirth and the puerperium: Secondary | ICD-10-CM

## 2018-10-18 LAB — URINE CULTURE

## 2018-10-19 LAB — BETA HCG QUANT (REF LAB): HCG QUANT: 3586 m[IU]/mL

## 2018-10-22 ENCOUNTER — Encounter: Payer: Self-pay | Admitting: Obstetrics & Gynecology

## 2018-10-24 ENCOUNTER — Encounter: Payer: Self-pay | Admitting: Obstetrics & Gynecology

## 2018-10-27 ENCOUNTER — Telehealth: Payer: Self-pay

## 2018-10-27 NOTE — Telephone Encounter (Signed)
Pt has appt 3/26 for first u/a.  Is wondering if she should come in sooner b/c she is feeling like she has and inf maybe along the lines of PID.  [phone]

## 2018-10-30 NOTE — Telephone Encounter (Signed)
Pt aware.  Is okay to wait until Thursday.

## 2018-11-02 ENCOUNTER — Other Ambulatory Visit: Payer: Self-pay

## 2018-11-02 ENCOUNTER — Ambulatory Visit (INDEPENDENT_AMBULATORY_CARE_PROVIDER_SITE_OTHER): Payer: 59 | Admitting: Certified Nurse Midwife

## 2018-11-02 ENCOUNTER — Ambulatory Visit (INDEPENDENT_AMBULATORY_CARE_PROVIDER_SITE_OTHER): Payer: 59

## 2018-11-02 VITALS — BP 110/62 | Wt 189.0 lb

## 2018-11-02 DIAGNOSIS — O3481 Maternal care for other abnormalities of pelvic organs, first trimester: Secondary | ICD-10-CM

## 2018-11-02 DIAGNOSIS — Z3A01 Less than 8 weeks gestation of pregnancy: Secondary | ICD-10-CM

## 2018-11-02 DIAGNOSIS — O3411 Maternal care for benign tumor of corpus uteri, first trimester: Secondary | ICD-10-CM

## 2018-11-02 DIAGNOSIS — O3410 Maternal care for benign tumor of corpus uteri, unspecified trimester: Secondary | ICD-10-CM

## 2018-11-02 DIAGNOSIS — O0990 Supervision of high risk pregnancy, unspecified, unspecified trimester: Secondary | ICD-10-CM

## 2018-11-02 DIAGNOSIS — N8311 Corpus luteum cyst of right ovary: Secondary | ICD-10-CM

## 2018-11-02 DIAGNOSIS — Z1379 Encounter for other screening for genetic and chromosomal anomalies: Secondary | ICD-10-CM

## 2018-11-02 DIAGNOSIS — Z8759 Personal history of other complications of pregnancy, childbirth and the puerperium: Secondary | ICD-10-CM

## 2018-11-02 DIAGNOSIS — D259 Leiomyoma of uterus, unspecified: Secondary | ICD-10-CM

## 2018-11-03 DIAGNOSIS — D259 Leiomyoma of uterus, unspecified: Secondary | ICD-10-CM | POA: Insufficient documentation

## 2018-11-03 DIAGNOSIS — O3410 Maternal care for benign tumor of corpus uteri, unspecified trimester: Secondary | ICD-10-CM | POA: Insufficient documentation

## 2018-11-03 DIAGNOSIS — O0990 Supervision of high risk pregnancy, unspecified, unspecified trimester: Secondary | ICD-10-CM

## 2018-11-03 DIAGNOSIS — O0991 Supervision of high risk pregnancy, unspecified, first trimester: Secondary | ICD-10-CM | POA: Insufficient documentation

## 2018-11-03 HISTORY — DX: Supervision of high risk pregnancy, unspecified, unspecified trimester: O09.90

## 2018-11-03 MED ORDER — PRENATE PIXIE 10-0.6-0.4-200 MG PO CAPS: 1.0000 | ORAL_CAPSULE | Freq: Every day | ORAL | 11 refills | Status: DC

## 2018-11-03 NOTE — Progress Notes (Signed)
ROB at 7 weeks for dating/viability scan. Would also like RX sent in for Prenate Pixie. On ultrasound there is a SIUP wit CRL c/w 7wk4d gestation. Will keep EDC at 06/21/2019 by sure LMP. Also seen was a large anterior fibroid measuring 7.3 x 8.2 x 7.5 cm. Has history of having a previous myomectomy Explained possible problems with what appears to be a intramural fibroid including degeneration and pain, and increased pelvic pressure. Her fundus is palpable to about 1/2 between her SP and her U now. She is experiencing increased pelvic pressure and is uncomfortable lying on her right side.  DIscussed genetic testing and she is interested. Will schedule for first trimester test in 4-5 weeks. She is debating whether to have the cell free DNA test done.  Dalia Heading, CNM

## 2018-11-24 ENCOUNTER — Other Ambulatory Visit: Payer: Self-pay | Admitting: Certified Nurse Midwife

## 2018-11-28 ENCOUNTER — Encounter: Payer: Self-pay | Admitting: Obstetrics & Gynecology

## 2018-12-04 ENCOUNTER — Other Ambulatory Visit: Payer: Self-pay

## 2018-12-04 ENCOUNTER — Ambulatory Visit (INDEPENDENT_AMBULATORY_CARE_PROVIDER_SITE_OTHER): Payer: 59 | Admitting: Advanced Practice Midwife

## 2018-12-04 ENCOUNTER — Encounter: Payer: Self-pay | Admitting: Advanced Practice Midwife

## 2018-12-04 ENCOUNTER — Ambulatory Visit (INDEPENDENT_AMBULATORY_CARE_PROVIDER_SITE_OTHER): Payer: 59

## 2018-12-04 VITALS — BP 118/60 | Wt 196.0 lb

## 2018-12-04 DIAGNOSIS — Z3687 Encounter for antenatal screening for uncertain dates: Secondary | ICD-10-CM

## 2018-12-04 DIAGNOSIS — Z1379 Encounter for other screening for genetic and chromosomal anomalies: Secondary | ICD-10-CM

## 2018-12-04 DIAGNOSIS — Z3A11 11 weeks gestation of pregnancy: Secondary | ICD-10-CM

## 2018-12-04 DIAGNOSIS — O0991 Supervision of high risk pregnancy, unspecified, first trimester: Secondary | ICD-10-CM

## 2018-12-04 DIAGNOSIS — O0990 Supervision of high risk pregnancy, unspecified, unspecified trimester: Secondary | ICD-10-CM

## 2018-12-04 LAB — POCT URINALYSIS DIPSTICK OB
Glucose, UA: NEGATIVE
POC,PROTEIN,UA: NEGATIVE

## 2018-12-04 NOTE — Progress Notes (Signed)
  Routine Prenatal Care Visit  Subjective  Paige Davidson is a 28 y.o. G2P0010 at [redacted]w[redacted]d being seen today for ongoing prenatal care.  She is currently monitored for the following issues for this high-risk pregnancy and has History of ectopic pregnancy; Supervision of high risk pregnancy, antepartum; and Uterine fibroid during pregnancy, antepartum on their problem list.  ----------------------------------------------------------------------------------- Patient reports some difficulty sleeping due to pain of fibroid.    . Vag. Bleeding: None.   . Denies leaking of fluid.  ----------------------------------------------------------------------------------- The following portions of the patient's history were reviewed and updated as appropriate: allergies, current medications, past family history, past medical history, past social history, past surgical history and problem list. Problem list updated.   Objective  Blood pressure 118/60, weight 196 lb (88.9 kg), last menstrual period 09/14/2018. Pregravid weight 175 lb (79.4 kg) Total Weight Gain 21 lb (9.526 kg) Urinalysis: Urine Protein Negative  Urine Glucose Negative  Fetal Status: Fetal Heart Rate (bpm): 166         Patient Name: Paige Davidson DOB: Dec 15, 1990 MRN: [id number] ULTRASOUND REPORT  Location: [facility] Date of Service: 12/04/2018   Indications:dating Findings:  Nelda [place]se[place] intrauterine pregnancy is visualized with a CRL consistent with [redacted]w[redacted]d gestation, giving an (U/S) EDD of 06/18/2019. The (U/S) EDD is/ consistent with the clinically established EDD of 06/21/2019.  FHR: 169 BPM CRL measurement: 53.5 mm Yolk sac is visualized and appears normal and early anatomy is normal. Amnion: visualized and appears normal  NT measurement is 1.4 mm.   Right Ovary is not visualized. Left Ovary is not visualized. Corpus luteal cyst:  is not visualized Survey of the adnexa demonstrates large solid adnexal mass measuring  9.0 x 8.67 x 10.54cm. There is no free peritoneal fluid in the cul de sac.  Impression: 1. [redacted]w[redacted]d Viable Singleton Intrauterine pregnancy by U/S. 2. (U/S) EDD is consistent with Clinically established EDD of 06/21/2019. 3. NT measurement is 1.4 mm. 4. Large solid mass measuring 9.0 x 8.67 x 10.54 cm. No color with in the mass. 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  Recommendations: 1.Clinical correlation with the patient's History and Physical Exam.  Lillia Dallas, RDMS   General:  Alert, oriented and cooperative. Patient is in no acute distress.  Skin: Skin is warm and dry. No rash noted.   Cardiovascular: Normal heart rate noted  Respiratory: Normal respiratory effort, no problems with respiration noted  Abdomen: Soft, gravid, appropriate for gestational age.       Pelvic:  Cervical exam deferred        Extremities: Normal range of motion.     Mental Status: Normal mood and affect. Normal behavior. Normal judgment and thought content.   Assessment   28 y.o. G2P0010 at [redacted]w[redacted]d by  06/21/2019, by Last Menstrual Period presenting for routine prenatal visit  Plan   pregnancy 2 Problems (from 09/14/18 to present)    No problems associated with this episode.       Preterm labor symptoms and general obstetric precautions including but not limited to vaginal bleeding, contractions, leaking of fluid and fetal movement were reviewed in detail with the patient. Please refer to After Visit Summary for other counseling recommendations. Reviewed safe sleep aid OTC meds and herbs   Return in about 8 weeks (around 01/29/2019) for anatomy scan and rob.  Rod Can, CNM 12/04/2018 3:09 PM

## 2018-12-04 NOTE — Progress Notes (Signed)
ROB/NT

## 2018-12-04 NOTE — Patient Instructions (Signed)

## 2018-12-08 HISTORY — PX: MYOMECTOMY: SHX85

## 2018-12-18 ENCOUNTER — Telehealth: Payer: Self-pay

## 2018-12-18 LAB — FIRST TRIMESTER SCREEN W/NT
CRL: 53.5 mm
DIA MoM: 0.91
DIA Value: 195.9 pg/mL
Gest Age-Collect: 11.9 weeks
Maternal Age At EDD: 30.5 yr
Nuchal Translucency MoM: 1.07
Nuchal Translucency: 1.4 mm
Number of Fetuses: 1
PAPP-A MoM: 1.02
PAPP-A Value: 603.7 ng/mL
Test Results:: NEGATIVE
Weight: 196 [lb_av]
hCG MoM: 1.02
hCG Value: 88.4 IU/mL

## 2018-12-18 NOTE — Telephone Encounter (Signed)
[facility] calling for sonographer's ID #.  [phone] opt 2,3.  Specimen # [id number].  Info given:  Paige Davidson [phone] as Ashby Dawes doesn't have a # yet.

## 2019-01-02 ENCOUNTER — Encounter: Payer: Self-pay | Admitting: Emergency Medicine

## 2019-01-02 ENCOUNTER — Other Ambulatory Visit: Payer: Self-pay

## 2019-01-02 ENCOUNTER — Emergency Department
Admission: EM | Admit: 2019-01-02 | Discharge: 2019-01-02 | Disposition: A | Discharge status: Home / Self Care | Payer: 59 | Attending: Emergency Medicine | Admitting: Emergency Medicine

## 2019-01-02 ENCOUNTER — Emergency Department: Payer: 59

## 2019-01-02 ENCOUNTER — Telehealth: Payer: Self-pay | Admitting: Obstetrics & Gynecology

## 2019-01-02 DIAGNOSIS — R102 Pelvic and perineal pain: Secondary | ICD-10-CM | POA: Insufficient documentation

## 2019-01-02 DIAGNOSIS — O26899 Other specified pregnancy related conditions, unspecified trimester: Secondary | ICD-10-CM | POA: Diagnosis not present

## 2019-01-02 DIAGNOSIS — Z3A16 16 weeks gestation of pregnancy: Secondary | ICD-10-CM | POA: Diagnosis not present

## 2019-01-02 DIAGNOSIS — R109 Unspecified abdominal pain: Secondary | ICD-10-CM

## 2019-01-02 DIAGNOSIS — O26892 Other specified pregnancy related conditions, second trimester: Secondary | ICD-10-CM

## 2019-01-02 LAB — COMPREHENSIVE METABOLIC PANEL
ALT: 10 U/L (ref 0–44)
AST: 15 U/L (ref 15–41)
Albumin: 3.6 g/dL (ref 3.5–5.0)
Alkaline Phosphatase: 51 U/L (ref 38–126)
Anion gap: 8 (ref 5–15)
BUN: 7 mg/dL (ref 6–20)
CO2: 21 mmol/L — ABNORMAL LOW (ref 22–32)
Calcium: 8.8 mg/dL — ABNORMAL LOW (ref 8.9–10.3)
Chloride: 106 mmol/L (ref 98–111)
Creatinine, Ser: 0.56 mg/dL (ref 0.44–1.00)
GFR calc Af Amer: >60 mL/min (ref >60)
GFR calc non Af Amer: >60 mL/min (ref >60)
Glucose, Bld: 106 mg/dL — ABNORMAL HIGH (ref 70–99)
Potassium: 3.5 mmol/L (ref 3.5–5.1)
Sodium: 135 mmol/L (ref 135–145)
Total Bilirubin: 0.5 mg/dL (ref 0.3–1.2)
Total Protein: 7.2 g/dL (ref 6.5–8.1)

## 2019-01-02 LAB — CBC
HCT: 36.4 % (ref 36.0–46.0)
Hemoglobin: 12.8 g/dL (ref 12.0–15.0)
MCH: 32.4 pg (ref 26.0–34.0)
MCHC: 35.2 g/dL (ref 30.0–36.0)
MCV: 92.2 fL (ref 80.0–100.0)
Platelets: 251 10*3/uL (ref 150–400)
RBC: 3.95 MIL/uL (ref 3.87–5.11)
RDW: 12.4 % (ref 11.5–15.5)
WBC: 8.9 10*3/uL (ref 4.0–10.5)
nRBC: 0 % (ref 0.0–0.2)

## 2019-01-02 LAB — HCG, QUANTITATIVE, PREGNANCY: hCG, Beta Chain, Quant, S: 36692 m[IU]/mL — ABNORMAL HIGH (ref <5)

## 2019-01-02 LAB — ABO/RH: ABO/RH(D): B POS

## 2019-01-02 MED ORDER — OXYCODONE-ACETAMINOPHEN 5-325 MG PO TABS: 1.0000 | ORAL_TABLET | Freq: Four times a day (QID) | ORAL | 0 refills | Status: DC | PRN

## 2019-01-02 NOTE — Telephone Encounter (Signed)
Patient is was seen today 01/02/19 in the ER. I have patient coming in on Friday, 01/05/19 at 8:40. Does patient need a more sooner Follow up. And does the appointment need to be in office or over phone. Please advise

## 2019-01-02 NOTE — ED Notes (Signed)
Pt continues to await [place].

## 2019-01-02 NOTE — ED Triage Notes (Signed)
Pt reports it [redacted] weeks pregnant and for the past few days has had persistent pain to her lower abd. Pt denies vaginal bleeding or other sx's. Pt is seen at [facility], last visit was in April.

## 2019-01-02 NOTE — ED Provider Notes (Signed)
[facility] Emergency Department Provider Note  ____________________________________________   First MD Initiated Contact with Patient 01/02/19 1040     (approximate)  I have reviewed the triage vital signs and the nursing notes.   HISTORY  Chief Complaint Abdominal Pain   HPI Paige Davidson is a 28 y.o. female who is pregnant just about 16 weeks  Patient ports a history of fibroids.  She had one previous tubal or some type of ectopic in the past.  She reports this pregnancy has gone well except for some nausea and morning sickness except on Friday she started to experience sharp pain that sort of radiates out from both sides of her pelvic region and when she stands and walks it seems like a sharp pain will shoot up towards her lower back.  No contractions.  The pain is sharp in nature.  It is relieved by laying at rest and worsened when she tries to stand and walk.  She has had no vaginal bleeding or discharge with it.  No nausea vomiting or fevers or chills.  Feels the pregnancy is going okay and has not noticed any concerns with the baby except she does note there is at least 1 or 2 large fibroids associated with the pregnancy that Dr. Kenton Kingfisher at [facility] he has been following.   Past Medical History:  Diagnosis Date  . Fibroids     Patient Active Problem List   Diagnosis Date Noted  . Supervision of high risk pregnancy, antepartum 11/03/2018  . Uterine fibroid during pregnancy, antepartum 11/03/2018  . History of ectopic pregnancy 10/16/2018    Past Surgical History:  Procedure Laterality Date  . CYSTECTOMY    . MYOMECTOMY      Prior to Admission medications   Medication Sig Start Date End Date Taking? Authorizing Provider  Prenat w/o A Vit-FeFum-FePo-FA (CONCEPT OB) 130-92.4-1 MG CAPS Take 1 capsule by mouth daily. 12/12/18  Yes [provider]  oxyCODONE-acetaminophen (PERCOCET/ROXICET) 5-325 MG tablet Take 1 tablet by mouth  every 6 (six) hours as needed for severe pain. 01/02/19   Delman Kitten, MD  Prenat-FeAsp-Meth-FA-DHA w/o A (PRENATE PIXIE) 10-0.6-0.4-200 MG CAPS Take 1 capsule by mouth daily. Patient not taking: Reported on 01/02/2019 11/03/18   Dalia Heading, CNM    Allergies Penicillins  Family History  Problem Relation Age of Onset  . Breast cancer Maternal Grandmother   . Diabetes Maternal Grandfather   . Hypertension Paternal Grandfather   . Breast cancer Paternal Aunt   . Breast cancer Paternal Aunt     Social History Social History   Tobacco Use  . Smoking status: Never Smoker  . Smokeless tobacco: Never Used  Substance Use Topics  . Alcohol use: Yes    Comment: occasionally   . Drug use: No    Review of Systems Constitutional: No fever/chills Eyes: No visual changes. ENT: No sore throat. Cardiovascular: Denies chest pain. Respiratory: Denies shortness of breath. Gastrointestinal: No abdominal pain rather reports that the pain in her lower pelvis and sharp in nature.   Genitourinary: Negative for dysuria. Musculoskeletal: Negative for back pain. Skin: Negative for rash. Neurological: Negative for headaches, areas of focal weakness or numbness.    ____________________________________________   PHYSICAL EXAM:  VITAL SIGNS: ED Triage Vitals  Enc Vitals Group     BP 01/02/19 0911 108/64     Pulse Rate 01/02/19 0911 93     Resp 01/02/19 0911 20     Temp 01/02/19 0911 98.2  F (36.8 C)     Temp Source 01/02/19 0911 Oral     SpO2 01/02/19 0911 98 %     Weight 01/02/19 0908 197 lb (89.4 kg)     Height 01/02/19 0908 5\' 6"  (1.676 m)     Head Circumference --      Peak Flow --      Pain Score 01/02/19 0908 7     Pain Loc --      Pain Edu? --      Excl. in [place]? --     Constitutional: Alert and oriented. Well appearing and in no acute distress. Eyes: Conjunctivae are normal. Head: Atraumatic. Nose: No congestion/rhinnorhea. Mouth/Throat: Mucous membranes are moist.  Neck: No stridor.  Cardiovascular: Normal rate, regular rhythm. Grossly normal heart sounds.  Good peripheral circulation. Respiratory: Normal respiratory effort.  No retractions. Lungs CTAB. Gastrointestinal: Soft and nontender except she is obviously gravid with a mass palpable at the upper border of the uterus, heart distinguish uterus from what appears to be some type of a mass and based on the patient's history of suspect this is a fibroid.  She reports just some slight discomfort to palpation left lower quadrant.  No rebound or guarding.  No focal pain to McBurney's point.  Negative Murphy.  No peritonitis any quadrant. Musculoskeletal: No lower extremity tenderness nor edema. Neurologic:  Normal speech and language. No gross focal neurologic deficits are appreciated.  Skin:  Skin is warm, dry and intact. No rash noted. Psychiatric: Mood and affect are normal. Speech and behavior are normal.  ____________________________________________   LABS (all labs ordered are listed, but only abnormal results are displayed)  Labs Reviewed  HCG, QUANTITATIVE, PREGNANCY - Abnormal; Notable for the following components:      Result Value   hCG, Beta Chain, Quant, [place] 36,692 (*)    All other components within normal limits  COMPREHENSIVE METABOLIC PANEL - Abnormal; Notable for the following components:   CO2 21 (*)    Glucose, Bld 106 (*)    Calcium 8.8 (*)    All other components within normal limits  CBC  POC URINE PREG, ED  ABO/RH   ____________________________________________  EKG   ____________________________________________  RADIOLOGY  US Ob Limited > 14 Wks  Result Date: 01/02/2019 CLINICAL DATA:  28 year old pregnant female presents with sharp bilateral lower abdominal pain for several days. History of uterine fibroids. EDC by initial sonogram: 06/17/2019, projecting to an expected gestational age of [redacted] weeks 2 days. EXAM: LIMITED OBSTETRIC ULTRASOUND FINDINGS: Number of  Fetuses: 1 Heart Rate:  130 bpm Movement: Yes Presentation: Transverse with fetal head on maternal left Placental Location: Anterior Previa: No Amniotic Fluid (Subjective):  Within normal limits. Deepest vertical fluid pocket 4.4 cm. BPD: 3.4 cm 16 w  3 d MATERNAL FINDINGS: Cervix: Appears closed. Cervix length estimated at 3.3 cm on transabdominal views. Uterus/Adnexae: No adnexal mass demonstrated. Large subserosal 9.5 x 8.5 x 11.4 cm fundal fibroid, previously 10.5 x 7.9 x 13.0 cm on 11/02/2018 outside obstetric scan, not definitely changed, with no vascularity visualized within this fibroid on limited Doppler view. IMPRESSION: 1. Single living intrauterine gestation at 16 weeks 3 days by limited fetal biometry, concordant with the expected gestational age of [redacted] weeks 2 days by initial sonogram. 2. No acute gestational abnormality on this limited ED scan. Patient is due for dedicated fetal anatomic survey in 2-5 weeks. 3. Large 11.4 cm fundal fibroid, not definitely changed in size since 11/02/2018 outside obstetric scan. Apparent  absence of vascularity within this fibroid on limited Doppler view, cannot exclude acute fibroid degeneration as the cause of the patient's pain. This exam is performed on an emergent basis and does not comprehensively evaluate fetal size, dating, or anatomy; follow-up complete OB US should be considered if further fetal assessment is warranted. Electronically Signed   By: Ilona Sorrel M.D.   On: 01/02/2019 13:42    Radiographs reviewed, discussed with Dr. Kenton Kingfisher as well. ____________________________________________   PROCEDURES  Procedure(s) performed: None  Procedures  Critical Care performed: No  ____________________________________________   INITIAL IMPRESSION / ASSESSMENT AND PLAN / ED COURSE  Pertinent labs & imaging results that were available during my care of the patient were reviewed by me and considered in my medical decision making (see chart for  details).   Patient presents for sharp lower pelvic pain during pregnancy.  Reassuring examination.  Suspect based on the history is some type of ligamentous, fibroid related, or other musculoskeletal type of pain possibly related to pregnancy.  Does not appear to be consistent with a intra-abdominal infection or peritonitis.  No signs or symptoms suggest acute abdomen.  No associated infectious symptoms.  Clinical Course as of Jan 01 1413  Tue Jan 02, 2019  1400 Case and care discussed with Dr. Kenton Kingfisher. Knows patient, reviewed imaging. Advises close outpatient follow-up is appropriate. Dr. Kenton Kingfisher has reviewed ultrasound and labs with me. Dr. Kenton Kingfisher will have [facility] setup appointment for her by end of week. Recommends percocet short term good if patient desire.    [MQ]    Clinical Course User Index [MQ] Delman Kitten, MD   Return precautions and treatment recommendations and follow-up discussed with the patient who is agreeable with the plan.  I will prescribe the patient a narcotic pain medicine due to their condition which I anticipate will cause at least moderate pain short term. I discussed with the patient safe use of narcotic pain medicines, and that they are not to drive, work in dangerous areas, or ever take more than prescribed (no more than 1 pill every 6 hours). We discussed that this is the type of medication that can be  overdosed on and the risks of this type of medicine. Patient is very agreeable to only use as prescribed and to never use more than prescribed.  Patient is not driving herself home.  Discussed risks and benefits of Percocet use specially during pregnancy with the patient.  Also discussed with Dr. Kenton Kingfisher her OB who actually recommended Percocet use short-term  ____________________________________________   FINAL CLINICAL IMPRESSION(S) / ED DIAGNOSES  Final diagnoses:  Pelvic pain affecting pregnancy in second trimester, antepartum        Note:  This document  was prepared using Dragon voice recognition software and may include unintentional dictation errors       Delman Kitten, MD 01/02/19 1415

## 2019-01-02 NOTE — ED Notes (Signed)
Pt resting in bed, c/o mild "cramping pain from Dr pushing on my stomach." Appears in no distress. Placed on blood pressure cuff and pulse ox. VSS.

## 2019-01-02 NOTE — Discharge Instructions (Signed)
No driving today or while using oxycodone.  Please call [facility] to set up an appointment for follow-up with Dr. Kenton Kingfisher this week.

## 2019-01-02 NOTE — Telephone Encounter (Signed)
appt fri  Received: Today  Message Contents  Gae Dry, MD  [facility] tele (and ER f/u) ok    Patient aware via voicemail about appointment type

## 2019-01-02 NOTE — ED Notes (Signed)
ED Provider at bedside. 

## 2019-01-02 NOTE — ED Notes (Signed)
Patient transported to Ultrasound 

## 2019-01-02 NOTE — ED Notes (Signed)
Pt alert and oriented X4, active, cooperative, pt in NAD. RR even and unlabored, color WNL.  Pt informed to return if any life threatening symptoms occur.  Discharge and followup instructions reviewed. ambulates safely. Prescriptions and followup discussed.

## 2019-01-03 ENCOUNTER — Encounter: Payer: 59 | Admitting: Obstetrics & Gynecology

## 2019-01-05 ENCOUNTER — Other Ambulatory Visit: Payer: Self-pay

## 2019-01-05 ENCOUNTER — Ambulatory Visit (INDEPENDENT_AMBULATORY_CARE_PROVIDER_SITE_OTHER): Payer: 59 | Admitting: Obstetrics & Gynecology

## 2019-01-05 VITALS — BP 100/60 | Wt 199.0 lb

## 2019-01-05 DIAGNOSIS — R102 Pelvic and perineal pain: Secondary | ICD-10-CM

## 2019-01-05 DIAGNOSIS — D259 Leiomyoma of uterus, unspecified: Secondary | ICD-10-CM | POA: Diagnosis not present

## 2019-01-05 DIAGNOSIS — O3410 Maternal care for benign tumor of corpus uteri, unspecified trimester: Secondary | ICD-10-CM

## 2019-01-05 NOTE — Progress Notes (Signed)
HPI: Abdominal Pain Patient presents for evaluation of abdominal pain. The pain is described as cramping and pressure-like, and is 6/10 in intensity. Pain is located in the suprapubic and periumbilical area without radiation. Onset was intermittent occurring several days ago. Symptoms have been unchanged since. Aggravating factors: none. Alleviating factors: none. Associated symptoms: bloating at times; no fever or nausea or VB. The patient denies constipation, diarrhea and headache. Risk factors for pelvic/abdominal pain include uterine fibroids and pregnancy.  Ultrasound demonstrates no grwoth in fibroid, about 11 cm in size, with diminished blood flow that could be related to degeneration These findings are compatible with the IUP at this time, fibroid is subserosal and appears non-obstructive and non-competing for growing fetus  PMHx: She  has a past medical history of Fibroids. Also,  has a past surgical history that includes Cystectomy and Myomectomy., family history includes Breast cancer in her maternal grandmother, paternal aunt, and paternal aunt; Diabetes in her maternal grandfather; Hypertension in her paternal grandfather.,  reports that she has never smoked. She has never used smokeless tobacco. She reports current alcohol use. She reports that she does not use drugs.  She has a current medication list which includes the following prescription(s): oxycodone-acetaminophen, concept ob, and prenate pixie. Also, is allergic to penicillins.  Review of Systems  All other systems reviewed and are negative.  Objective: BP 100/60   Wt 199 lb (90.3 kg)   LMP 09/14/2018   BMI 32.12 kg/m   Physical examination Constitutional NAD, Conversant  Skin No rashes, lesions or ulceration.   Extremities: Moves all appropriately.  Normal ROM for age. No lymphadenopathy.  Neuro: Abd: Grossly intact Gravid NT, FH 25, FHT 150s  Psych: Oriented to PPT.  Normal mood. Normal affect.   Results for  orders placed or performed during the hospital encounter of 01/02/19  hCG, quantitative, pregnancy  Result Value Ref Range   hCG, Beta Chain, Quant, S [phone] (H) <5 mIU/mL  Comprehensive metabolic panel  Result Value Ref Range   Sodium 135 135 - 145 mmol/L   Potassium 3.5 3.5 - 5.1 mmol/L   Chloride 106 98 - 111 mmol/L   CO2 21 (L) 22 - 32 mmol/L   Glucose, Bld 106 (H) 70 - 99 mg/dL   BUN 7 6 - 20 mg/dL   Creatinine, Ser 0.56 0.44 - 1.00 mg/dL   Calcium 8.8 (L) 8.9 - 10.3 mg/dL   Total Protein 7.2 6.5 - 8.1 g/dL   Albumin 3.6 3.5 - 5.0 g/dL   AST 15 15 - 41 U/L   ALT 10 0 - 44 U/L   Alkaline Phosphatase 51 38 - 126 U/L   Total Bilirubin 0.5 0.3 - 1.2 mg/dL   GFR calc non Af Amer >60 >60 mL/min   GFR calc Af Amer >60 >60 mL/min   Anion gap 8 5 - 15  CBC  Result Value Ref Range   WBC 8.9 4.0 - 10.5 K/uL   RBC 3.95 3.87 - 5.11 MIL/uL   Hemoglobin 12.8 12.0 - 15.0 g/dL   HCT 36.4 36.0 - 46.0 %   MCV 92.2 80.0 - 100.0 fL   MCH 32.4 26.0 - 34.0 pg   MCHC 35.2 30.0 - 36.0 g/dL   RDW 12.4 11.5 - 15.5 %   Platelets 251 150 - 400 K/uL   nRBC 0.0 0.0 - 0.2 %  ABO/Rh  Result Value Ref Range   ABO/RH(D)      B POS Performed at [facility], [address] [place], [place] [address]     Assessment:  Uterine fibroid during pregnancy, antepartum Pelvic pain  Continued monitor for pain and growth.  No interventions available while pregnant.  Pain meds as needed.  Risks of chronic narcotic use and pregnancy discussed.  Abdominal support measures, heat, rest, Tylenol.  A total of 15 minutes were spent face-to-face with the patient during this encounter and over half of that time dealt with counseling and coordination of care.  Barnett Applebaum, MD, [facility], [facility] 01/05/2019  9:00 AM

## 2019-01-22 ENCOUNTER — Encounter: Payer: Self-pay | Admitting: Obstetrics & Gynecology

## 2019-01-29 ENCOUNTER — Ambulatory Visit (INDEPENDENT_AMBULATORY_CARE_PROVIDER_SITE_OTHER): Payer: 59

## 2019-01-29 ENCOUNTER — Ambulatory Visit (INDEPENDENT_AMBULATORY_CARE_PROVIDER_SITE_OTHER): Payer: 59 | Admitting: Obstetrics and Gynecology

## 2019-01-29 ENCOUNTER — Other Ambulatory Visit: Payer: Self-pay

## 2019-01-29 ENCOUNTER — Encounter: Payer: Self-pay | Admitting: Obstetrics and Gynecology

## 2019-01-29 VITALS — BP 100/60 | Wt 207.0 lb

## 2019-01-29 DIAGNOSIS — Z363 Encounter for antenatal screening for malformations: Secondary | ICD-10-CM

## 2019-01-29 DIAGNOSIS — O09292 Supervision of pregnancy with other poor reproductive or obstetric history, second trimester: Secondary | ICD-10-CM

## 2019-01-29 DIAGNOSIS — O0990 Supervision of high risk pregnancy, unspecified, unspecified trimester: Secondary | ICD-10-CM

## 2019-01-29 DIAGNOSIS — O3412 Maternal care for benign tumor of corpus uteri, second trimester: Secondary | ICD-10-CM

## 2019-01-29 DIAGNOSIS — Z8759 Personal history of other complications of pregnancy, childbirth and the puerperium: Secondary | ICD-10-CM

## 2019-01-29 DIAGNOSIS — Z3A19 19 weeks gestation of pregnancy: Secondary | ICD-10-CM

## 2019-01-29 DIAGNOSIS — O3410 Maternal care for benign tumor of corpus uteri, unspecified trimester: Secondary | ICD-10-CM

## 2019-01-29 DIAGNOSIS — O0992 Supervision of high risk pregnancy, unspecified, second trimester: Secondary | ICD-10-CM

## 2019-01-29 DIAGNOSIS — D259 Leiomyoma of uterus, unspecified: Secondary | ICD-10-CM

## 2019-01-29 NOTE — Progress Notes (Signed)
Routine Prenatal Care Visit  Subjective  Paige Davidson is a 28 y.o. G2P0010 at [redacted]w[redacted]d being seen today for ongoing prenatal care.  She is currently monitored for the following issues for this high-risk pregnancy and has History of ectopic pregnancy; Supervision of high risk pregnancy, antepartum; and Uterine fibroid during pregnancy, antepartum on their problem list.  ----------------------------------------------------------------------------------- Patient reports occasional LUQ pain, bilateral sciatic pain.    . Vag. Bleeding: None.  Movement: Absent. Denies leaking of fluid.  U/s incomplete for cardiac views ----------------------------------------------------------------------------------- The following portions of the patient's history were reviewed and updated as appropriate: allergies, current medications, past family history, past medical history, past social history, past surgical history and problem list. Problem list updated.   Objective  Blood pressure 100/60, weight 207 lb (93.9 kg), last menstrual period 09/14/2018. Pregravid weight 175 lb (79.4 kg) Total Weight Gain 32 lb (14.5 kg) Urinalysis: Urine Protein    Urine Glucose    Fetal Status: Fetal Heart Rate (bpm): present-normal   Movement: Absent     General:  Alert, oriented and cooperative. Patient is in no acute distress.  Skin: Skin is warm and dry. No rash noted.   Cardiovascular: Normal heart rate noted  Respiratory: Normal respiratory effort, no problems with respiration noted  Abdomen: Soft, gravid, appropriate for gestational age. Pain/Pressure: Present     Pelvic:  Cervical exam deferred        Extremities: Normal range of motion.  Edema: None  Mental Status: Normal mood and affect. Normal behavior. Normal judgment and thought content.   Imaging Results US Ob Comp + 14 Wk  Result Date: 01/29/2019 Patient Name: Paige Davidson DOB: 1991/02/22 MRN: [id number] ULTRASOUND REPORT Location: [facility]  Date of Service: 01/29/2019 Indications:Anatomy Ultrasound Findings: Nelda Marse[place] intrauterine pregnancy is visualized with FHR at 152 BPM. Biometrics give an (U/S) Gestational age of [redacted]w[redacted]d and an (U/S) EDD of 06/12/2019; this correlates with the clinically established Estimated Date of Delivery: 06/21/2019.  Fetal presentation is Breech. EFW: 359g (13 oz). Placenta: fundal. Grade: 1 AFI: subjectively normal. The cervix measures 3.7 cm Anatomic survey is incomplete for heart views and normal; Gender - surprise.  Impression: 1. [redacted]w[redacted]d Viable Singleton Intrauterine pregnancy by U/S. 2. (U/S) EDD is consistent with Clinically established Estimated Date of Delivery: 06/21/19 . 3. Normal Anatomy Scan 4. There is a large pedunculated fibroid in the superior left uterus measuring 13.9 x 8.5 x 11.2 cm.  There was only a small amount of blood flow seen within the periphery of the fibroid. Recommendations: 1.Clinical correlation with the patient's History and Physical Exam. 2. Follow up ultrasound for remaining heart views (LVOT, RVOT) not noted today due to fetal position. Gweneth Dimitri, RT There is a singleton gestation with subjectively normal amniotic fluid volume. The fetal biometry correlates with established dating. Detailed evaluation of the fetal anatomy was performed.The fetal anatomical survey appears within normal limits within the resolution of ultrasound as described above.  Not all structures were able to be visualized on today's study.  It is recommended that a follow-up ultrasound be performed to complete visualization of the unobserved structures today.  It must be noted that a normal ultrasound is unable to rule out fetal aneuploidy nor is it able to detect all possible malformations.    The ultrasound images and findings were reviewed by me and I agree with the above report. Prentice Docker, MD, [facility], [facility] 01/29/2019 2:33 PM       Assessment  28 y.o. G2P0010 at  [redacted]w[redacted]d by  06/21/2019, by Last Menstrual Period presenting for routine prenatal visit  Plan   pregnancy 2 Problems (from 09/14/18 to present)    Problem Noted Resolved   Supervision of high risk pregnancy, antepartum 11/03/2018 by Dalia Heading, CNM No   Uterine fibroid during pregnancy, antepartum 11/03/2018 by Dalia Heading, CNM No   Overview Signed 11/03/2018  8:34 AM by Dalia Heading, CNM    7.3 x 8.2 x 7.5 cm anterior fibroid at 7 week ultrasound          Preterm labor symptoms and general obstetric precautions including but not limited to vaginal bleeding, contractions, leaking of fluid and fetal movement were reviewed in detail with the patient. Please refer to After Visit Summary for other counseling recommendations.   - schedule u/s for completion anatomy - reviewed risks of fibroid - discussed stretches for sciatic pain.   Return in about 2 weeks (around 02/12/2019) for (2-4 wks) Ultrasound for completion anatomy and routine prenatal after .  Prentice Docker, MD, [facility], [facility] 01/29/2019 2:48 PM

## 2019-02-12 ENCOUNTER — Ambulatory Visit (INDEPENDENT_AMBULATORY_CARE_PROVIDER_SITE_OTHER): Payer: 59

## 2019-02-12 ENCOUNTER — Other Ambulatory Visit: Payer: Self-pay

## 2019-02-12 ENCOUNTER — Encounter: Payer: Self-pay | Admitting: Maternal Newborn

## 2019-02-12 ENCOUNTER — Ambulatory Visit (INDEPENDENT_AMBULATORY_CARE_PROVIDER_SITE_OTHER): Payer: 59 | Admitting: Maternal Newborn

## 2019-02-12 VITALS — BP 108/62 | Wt 210.0 lb

## 2019-02-12 DIAGNOSIS — Z362 Encounter for other antenatal screening follow-up: Secondary | ICD-10-CM

## 2019-02-12 DIAGNOSIS — D259 Leiomyoma of uterus, unspecified: Secondary | ICD-10-CM

## 2019-02-12 DIAGNOSIS — O09292 Supervision of pregnancy with other poor reproductive or obstetric history, second trimester: Secondary | ICD-10-CM

## 2019-02-12 DIAGNOSIS — Z3A21 21 weeks gestation of pregnancy: Secondary | ICD-10-CM

## 2019-02-12 DIAGNOSIS — Z3A19 19 weeks gestation of pregnancy: Secondary | ICD-10-CM

## 2019-02-12 DIAGNOSIS — O0990 Supervision of high risk pregnancy, unspecified, unspecified trimester: Secondary | ICD-10-CM

## 2019-02-12 LAB — POCT URINALYSIS DIPSTICK OB: Glucose, UA: NEGATIVE

## 2019-02-12 NOTE — Progress Notes (Signed)
Anatomy scan complete for cardiac views needed. No complaints

## 2019-02-12 NOTE — Patient Instructions (Signed)

## 2019-02-12 NOTE — Progress Notes (Signed)
    Routine Prenatal Care Visit  Subjective  Paige Davidson is a 28 y.o. G2P0010 at [redacted]w[redacted]d being seen today for ongoing prenatal care.  She is currently monitored for the following issues for this high-risk pregnancy and has History of ectopic pregnancy; Supervision of high risk pregnancy, antepartum; and Uterine fibroid during pregnancy, antepartum on their problem list.  ----------------------------------------------------------------------------------- Patient reports sciatic pain. She sometimes has pain from her uterine fibroid and has difficulty sleeping when this occurs. Contractions: Not present. Vag. Bleeding: None.  Movement: Present. No leaking of fluid.  ----------------------------------------------------------------------------------- The following portions of the patient's history were reviewed and updated as appropriate: allergies, current medications, past family history, past medical history, past social history, past surgical history and problem list. Problem list updated.   Objective  Blood pressure 108/62, weight 210 lb (95.3 kg), last menstrual period 09/14/2018. Pregravid weight 175 lb (79.4 kg) Total Weight Gain 35 lb (15.9 kg) Urinalysis:  Urine dipstick shows negative for glucose, positive for protein (trace).  Fetal Status: Fetal Heart Rate (bpm): Present   Movement: Present  Presentation: Vertex  General:  Alert, oriented and cooperative. Patient is in no acute distress.  Skin: Skin is warm and dry. No rash noted.   Cardiovascular: Normal heart rate noted  Respiratory: Normal respiratory effort, no problems with respiration noted  Abdomen: Soft, gravid, appropriate for gestational age. Pain/Pressure: Present     Pelvic:  Cervical exam deferred        Extremities: Normal range of motion.  Edema: None  Mental Status: Normal mood and affect. Normal behavior. Normal judgment and thought content.     Assessment   28 y.o. G2P0010 at [redacted]w[redacted]d, EDD 06/21/2019 by  Last Menstrual Period presenting for a routine prenatal visit.  Plan   pregnancy 2 Problems (from 09/14/18 to present)    Problem Noted Resolved   Supervision of high risk pregnancy, antepartum 11/03/2018 by Dalia Heading, CNM No   Uterine fibroid during pregnancy, antepartum 11/03/2018 by Dalia Heading, CNM No   Overview Signed 11/03/2018  8:34 AM by Dalia Heading, CNM    7.3 x 8.2 x 7.5 cm anterior fibroid at 7 week ultrasound       Anatomy scan complete for cardiac views and normal, FHR 153 bpm. Results reviewed with patient.  Please refer to After Visit Summary for other counseling recommendations.   Return in about 4 weeks (around 03/12/2019) for ROB.  Avel Sensor, CNM 02/12/2019  3:39 PM

## 2019-03-12 ENCOUNTER — Encounter: Payer: Self-pay | Admitting: Maternal Newborn

## 2019-03-12 ENCOUNTER — Ambulatory Visit (INDEPENDENT_AMBULATORY_CARE_PROVIDER_SITE_OTHER): Payer: 59 | Admitting: Maternal Newborn

## 2019-03-12 ENCOUNTER — Other Ambulatory Visit: Payer: Self-pay

## 2019-03-12 VITALS — BP 108/70 | Wt 213.6 lb

## 2019-03-12 DIAGNOSIS — O09292 Supervision of pregnancy with other poor reproductive or obstetric history, second trimester: Secondary | ICD-10-CM | POA: Diagnosis not present

## 2019-03-12 DIAGNOSIS — O0990 Supervision of high risk pregnancy, unspecified, unspecified trimester: Secondary | ICD-10-CM

## 2019-03-12 DIAGNOSIS — Z3A25 25 weeks gestation of pregnancy: Secondary | ICD-10-CM

## 2019-03-12 LAB — POCT URINALYSIS DIPSTICK OB
Glucose, UA: NEGATIVE
POC,PROTEIN,UA: NEGATIVE

## 2019-03-12 NOTE — Progress Notes (Signed)
ROB- no concerns 

## 2019-03-12 NOTE — Progress Notes (Signed)
Routine Prenatal Care Visit  Subjective  Paige Davidson is a 28 y.o. G2P0010 at [redacted]w[redacted]d being seen today for ongoing prenatal care.  She is currently monitored for the following issues for this high-risk pregnancy and has History of ectopic pregnancy; Supervision of high risk pregnancy, antepartum; and Uterine fibroid during pregnancy, antepartum on their problem list.  ----------------------------------------------------------------------------------- Patient reports no complaints. Sciatic pain has improved with stretches. Contractions: Not present. Vag. Bleeding: None.  Movement: Present. No leaking of fluid.  ----------------------------------------------------------------------------------- The following portions of the patient's history were reviewed and updated as appropriate: allergies, current medications, past family history, past medical history, past social history, past surgical history and problem list. Problem list updated.   Objective  Blood pressure 108/70, weight 213 lb 9.6 oz (96.9 kg), last menstrual period 09/14/2018. Pregravid weight 175 lb (79.4 kg) Total Weight Gain 38 lb 9.6 oz (17.5 kg) Urinalysis: Urine dipstick shows negative for glucose, protein.  Fetal Status: Fetal Heart Rate (bpm): 154   Movement: Present     General:  Alert, oriented and cooperative. Patient is in no acute distress.  Skin: Skin is warm and dry. No rash noted.   Cardiovascular: Normal heart rate noted  Respiratory: Normal respiratory effort, no problems with respiration noted  Abdomen: Soft, gravid, appropriate for gestational age. Pain/Pressure: Absent     Pelvic:  Cervical exam deferred        Extremities: Normal range of motion.     Mental Status: Normal mood and affect. Normal behavior. Normal judgment and thought content.     Assessment   28 y.o. G2P0010 at [redacted]w[redacted]d, EDD 06/21/2019 by Last Menstrual Period presenting for a routine prenatal visit.  Plan   pregnancy 2 Problems  (from 09/14/18 to present)    Problem Noted Resolved   Supervision of high risk pregnancy, antepartum 11/03/2018 by Dalia Heading, CNM No   Overview Addendum 02/12/2019  3:42 PM by Rexene Agent, [place] Prenatal Labs  Dating LMP Blood type: --/--/B POS Performed at [facility], [address] [place], [place] [address]  [phone])   Genetic Screen 1 Screen: Negative         Antibody:Negative (03/09 1660)  Anatomic [place] Complete 7/6 Rubella: 3.01 (03/09 6301) Varicella: Immune  GTT Early:               Third trimester:  RPR: Non Reactive (03/09 0952)   Rhogam N/A HBsAg: Negative (03/09 0952)   TDaP vaccine                       Flu Shot: HIV: Non Reactive (03/09 0952)   Baby Food                                GBS:   Contraception  Pap: 10/16/2018, NILM  CBB     CS/VBAC    Support Person              Uterine fibroid during pregnancy, antepartum 11/03/2018 by Dalia Heading, CNM No   Overview Signed 11/03/2018  8:34 AM by Dalia Heading, CNM    7.3 x 8.2 x 7.5 cm anterior fibroid at 7 week ultrasound       Considering delivery at [facility] need to transfer to a practice with providers who serve that hospital if she decides that she would like to give birth there.  Please refer to After Visit Summary for other counseling recommendations.   Return in about 3 weeks (around 04/02/2019) for ROB and GTT/28 week labs.  Avel Sensor, CNM 03/12/2019  3:29 PM

## 2019-03-12 NOTE — Patient Instructions (Signed)

## 2019-04-03 ENCOUNTER — Encounter: Payer: Self-pay | Admitting: Obstetrics and Gynecology

## 2019-04-03 ENCOUNTER — Ambulatory Visit (INDEPENDENT_AMBULATORY_CARE_PROVIDER_SITE_OTHER): Payer: 59 | Admitting: Obstetrics and Gynecology

## 2019-04-03 ENCOUNTER — Other Ambulatory Visit: Payer: Self-pay

## 2019-04-03 VITALS — BP 116/78 | Wt 217.6 lb

## 2019-04-03 DIAGNOSIS — O0993 Supervision of high risk pregnancy, unspecified, third trimester: Secondary | ICD-10-CM

## 2019-04-03 DIAGNOSIS — O26843 Uterine size-date discrepancy, third trimester: Secondary | ICD-10-CM | POA: Diagnosis not present

## 2019-04-03 DIAGNOSIS — D259 Leiomyoma of uterus, unspecified: Secondary | ICD-10-CM

## 2019-04-03 DIAGNOSIS — Z23 Encounter for immunization: Secondary | ICD-10-CM

## 2019-04-03 DIAGNOSIS — O3413 Maternal care for benign tumor of corpus uteri, third trimester: Secondary | ICD-10-CM | POA: Diagnosis not present

## 2019-04-03 DIAGNOSIS — O0990 Supervision of high risk pregnancy, unspecified, unspecified trimester: Secondary | ICD-10-CM

## 2019-04-03 DIAGNOSIS — Z3A28 28 weeks gestation of pregnancy: Secondary | ICD-10-CM

## 2019-04-03 HISTORY — DX: Uterine size-date discrepancy, third trimester: O26.843

## 2019-04-03 NOTE — Progress Notes (Signed)
Prenatal Visit Note Date: 04/03/2019 Clinic: [facility]  CC: Routine OB, transfer of care from [facility]  Subjective:  Paige Davidson is a 28 y.o. G2P0010 at [redacted]w[redacted]d being seen today for ongoing prenatal care.  She is currently monitored for the following issues for this low-risk pregnancy and has History of ectopic pregnancy; Supervision of high risk pregnancy, antepartum; Uterine fibroid during pregnancy, antepartum; and Size of fetus inconsistent with dates in third trimester on their problem list.  Patient reports no complaints.   Contractions: Not present. Vag. Bleeding: None.  Movement: Present. Denies leaking of fluid.   The following portions of the patient's history were reviewed and updated as appropriate: allergies, current medications, past family history, past medical history, past social history, past surgical history and problem list. Problem list updated.  Objective:   Vitals:   04/03/19 0823  BP: 116/78  Weight: 217 lb 9.6 oz (98.7 kg)    Fetal Status: Fetal Heart Rate (bpm): 152   Movement: Present     General:  Alert, oriented and cooperative. Patient is in no acute distress.  Skin: Skin is warm and dry. No rash noted.   Cardiovascular: Normal heart rate noted  Respiratory: Normal respiratory effort, no problems with respiration noted  Abdomen: Soft, gravid, appropriate for gestational age. Pain/Pressure: Absent     Pelvic:  Cervical exam deferred        Extremities: Normal range of motion.  Edema: None  Mental Status: Normal mood and affect. Normal behavior. Normal judgment and thought content.   Urinalysis:      Assessment and Plan:  Pregnancy: G2P0010 at [redacted]w[redacted]d  1. Supervision of high risk pregnancy, antepartum Routine care. D/w her re: [facility]. Transferring from [facility] due to desire to deliver at [facility]. D/w her re: practice set up, call, deliveries, etc - Glucose Tolerance, 2 Hours w/1 Hour - RPR - CBC - HIV Antibody (routine testing w  rflx) - Flu Vaccine QUAD 36+ mos IM (Fluarix, Quad PF) - Tdap vaccine greater than or equal to 7yo IM - [place] MFM OB DETAIL +14 WK; Future  2. Uterine fibroid during pregnancy, antepartum Had what appears to be (based on l/s port sites) RA-myomectomy by Dr. [name] in [place] in May 2019 due to bulk s/s. Marland Kitchen She states she was told she could have a vag delivery. Will send off ROI to follow.  I told her based off of [facility] u/s that her pedunculated fibroid should cause any issues in pregnancy except for risk of pain and contractions if it outgrows blood supply and may have bulk s/s.  - [place] MFM OB DETAIL +14 WK; Future  3. Size of fetus inconsistent with dates in third trimester Needs serial growth u/s due to inability to measure [facility]  Preterm labor symptoms and general obstetric precautions including but not limited to vaginal bleeding, contractions, leaking of fluid and fetal movement were reviewed in detail with the patient. Please refer to After Visit Summary for other counseling recommendations.  RTC: 2wks (can be virtual)   [name], MD  [name] [place]  May 2019

## 2019-04-04 LAB — GLUCOSE TOLERANCE, 2 HOURS W/ 1HR
Glucose, 1 hour: 136 mg/dL (ref 65–179)
Glucose, 2 hour: 97 mg/dL (ref 65–152)
Glucose, Fasting: 75 mg/dL (ref 65–91)

## 2019-04-04 LAB — CBC
Hematocrit: 36.5 % (ref 34.0–46.6)
Hemoglobin: 12.2 g/dL (ref 11.1–15.9)
MCH: 31.6 pg (ref 26.6–33.0)
MCHC: 33.4 g/dL (ref 31.5–35.7)
MCV: 95 fL (ref 79–97)
Platelets: 264 10*3/uL (ref 150–450)
RBC: 3.86 x10E6/uL (ref 3.77–5.28)
RDW: 12.2 % (ref 11.7–15.4)
WBC: 10.1 10*3/uL (ref 3.4–10.8)

## 2019-04-04 LAB — RPR: RPR Ser Ql: NONREACTIVE

## 2019-04-04 LAB — HIV ANTIBODY (ROUTINE TESTING W REFLEX): HIV Screen 4th Generation wRfx: NONREACTIVE

## 2019-04-05 ENCOUNTER — Other Ambulatory Visit (HOSPITAL_COMMUNITY): Payer: Self-pay | Admitting: *Deleted

## 2019-04-09 ENCOUNTER — Encounter: Payer: Self-pay | Admitting: Radiology

## 2019-04-10 ENCOUNTER — Ambulatory Visit (HOSPITAL_COMMUNITY)
Admission: RE | Admit: 2019-04-10 | Discharge: 2019-04-10 | Disposition: A | Discharge status: Home / Self Care | Payer: 59 | Source: Ambulatory Visit | Attending: Obstetrics and Gynecology | Admitting: Obstetrics and Gynecology

## 2019-04-10 ENCOUNTER — Other Ambulatory Visit: Payer: Self-pay

## 2019-04-10 ENCOUNTER — Encounter (HOSPITAL_COMMUNITY): Payer: Self-pay

## 2019-04-10 ENCOUNTER — Ambulatory Visit (HOSPITAL_COMMUNITY): Payer: 59 | Admitting: *Deleted

## 2019-04-10 DIAGNOSIS — D259 Leiomyoma of uterus, unspecified: Secondary | ICD-10-CM

## 2019-04-10 DIAGNOSIS — Z3A29 29 weeks gestation of pregnancy: Secondary | ICD-10-CM | POA: Diagnosis not present

## 2019-04-10 DIAGNOSIS — O0990 Supervision of high risk pregnancy, unspecified, unspecified trimester: Secondary | ICD-10-CM

## 2019-04-10 DIAGNOSIS — O0993 Supervision of high risk pregnancy, unspecified, third trimester: Secondary | ICD-10-CM | POA: Diagnosis not present

## 2019-04-10 DIAGNOSIS — O3412 Maternal care for benign tumor of corpus uteri, second trimester: Secondary | ICD-10-CM

## 2019-04-10 DIAGNOSIS — O3429 Maternal care due to uterine scar from other previous surgery: Secondary | ICD-10-CM | POA: Diagnosis not present

## 2019-04-10 DIAGNOSIS — O3413 Maternal care for benign tumor of corpus uteri, third trimester: Secondary | ICD-10-CM | POA: Diagnosis not present

## 2019-04-10 DIAGNOSIS — Z363 Encounter for antenatal screening for malformations: Secondary | ICD-10-CM

## 2019-04-11 ENCOUNTER — Other Ambulatory Visit (HOSPITAL_COMMUNITY): Payer: Self-pay | Admitting: *Deleted

## 2019-04-11 ENCOUNTER — Encounter: Payer: Self-pay | Admitting: Radiology

## 2019-04-11 DIAGNOSIS — O3410 Maternal care for benign tumor of corpus uteri, unspecified trimester: Secondary | ICD-10-CM

## 2019-04-11 DIAGNOSIS — D259 Leiomyoma of uterus, unspecified: Secondary | ICD-10-CM

## 2019-04-18 ENCOUNTER — Other Ambulatory Visit: Payer: Self-pay

## 2019-04-18 ENCOUNTER — Telehealth: Payer: Self-pay | Admitting: Radiology

## 2019-04-18 ENCOUNTER — Encounter: Payer: Self-pay | Admitting: Family Medicine

## 2019-04-18 ENCOUNTER — Telehealth (INDEPENDENT_AMBULATORY_CARE_PROVIDER_SITE_OTHER): Payer: 59 | Admitting: Family Medicine

## 2019-04-18 DIAGNOSIS — O3410 Maternal care for benign tumor of corpus uteri, unspecified trimester: Secondary | ICD-10-CM

## 2019-04-18 DIAGNOSIS — O26843 Uterine size-date discrepancy, third trimester: Secondary | ICD-10-CM

## 2019-04-18 DIAGNOSIS — O0993 Supervision of high risk pregnancy, unspecified, third trimester: Secondary | ICD-10-CM | POA: Diagnosis not present

## 2019-04-18 DIAGNOSIS — O3413 Maternal care for benign tumor of corpus uteri, third trimester: Secondary | ICD-10-CM | POA: Diagnosis not present

## 2019-04-18 DIAGNOSIS — D259 Leiomyoma of uterus, unspecified: Secondary | ICD-10-CM

## 2019-04-18 DIAGNOSIS — Z3A30 30 weeks gestation of pregnancy: Secondary | ICD-10-CM

## 2019-04-18 DIAGNOSIS — O0990 Supervision of high risk pregnancy, unspecified, unspecified trimester: Secondary | ICD-10-CM

## 2019-04-18 NOTE — Telephone Encounter (Signed)
Left message for patient to call to schedule next OB appt

## 2019-04-18 NOTE — Progress Notes (Signed)
   TELEHEALTH VIRTUAL OBSTETRICS VISIT ENCOUNTER NOTE  I connected with Paige Davidson on 04/22/19 at  1:00 PM EDT by telephone at home and verified that I am speaking with the correct person using two identifiers.   I discussed the limitations, risks, security and privacy concerns of performing an evaluation and management service by telephone and the availability of in person appointments. I also discussed with the patient that there may be a patient responsible charge related to this service. The patient expressed understanding and agreed to proceed.  Subjective:  Paige Davidson is a 28 y.o. G2P0010 at [redacted]w[redacted]d being followed for ongoing prenatal care.  She is currently monitored for the following issues for this low-risk pregnancy and has History of ectopic pregnancy; Supervision of high risk pregnancy, antepartum; Uterine fibroid during pregnancy, antepartum; and Size > Dates on their problem list.  Patient reports concerned about mode of delivery. Reports fetal movement. Denies any contractions, bleeding or leaking of fluid.   The following portions of the patient's history were reviewed and updated as appropriate: allergies, current medications, past family history, past medical history, past social history, past surgical history and problem list.   Objective:   General:  Alert, oriented and cooperative.   Mental Status: Normal mood and affect perceived. Normal judgment and thought content.  Rest of physical exam deferred due to type of encounter  Assessment and Plan:  Pregnancy: G2P0010 at [redacted]w[redacted]d 1. Supervision of high risk pregnancy, antepartum Reviewed pregnancy and currently UTD Reviewed with patient that records from myomectomy were received-- plan to message the team to assure mode of delivery is clear. On my reading of the operative report- the patient underwent lysis of adhesions and had quite severe adhesive disease. There was a predominate large posterior fibroid that was  removed but the surgeon notes that the myometrium plan was not encountered.     2. Uterine fibroid during pregnancy, antepartum   Preterm labor symptoms and general obstetric precautions including but not limited to vaginal bleeding, contractions, leaking of fluid and fetal movement were reviewed in detail with the patient.  I discussed the assessment and treatment plan with the patient. The patient was provided an opportunity to ask questions and all were answered. The patient agreed with the plan and demonstrated an understanding of the instructions. The patient was advised to call back or seek an in-person office evaluation/go to MAU at [facility] for any urgent or concerning symptoms. Please refer to After Visit Summary for other counseling recommendations.   I provided 15 minutes of non-face-to-face time during this encounter.  Return in about 2 weeks (around 05/02/2019) for Routine prenatal care, Telehealth/Virtual health OB Visit.  Future Appointments  Date Time Provider [place]  04/30/2019  3:15 PM [facility] [place], [place] for [place], [place]

## 2019-04-26 ENCOUNTER — Encounter: Payer: Self-pay | Admitting: Obstetrics and Gynecology

## 2019-04-26 DIAGNOSIS — N736 Female pelvic peritoneal adhesions (postinfective): Secondary | ICD-10-CM | POA: Insufficient documentation

## 2019-04-30 ENCOUNTER — Ambulatory Visit (HOSPITAL_COMMUNITY)
Admission: RE | Admit: 2019-04-30 | Discharge: 2019-04-30 | Disposition: A | Discharge status: Home / Self Care | Payer: 59 | Source: Ambulatory Visit | Attending: Obstetrics and Gynecology | Admitting: Obstetrics and Gynecology

## 2019-04-30 ENCOUNTER — Telehealth: Payer: Self-pay | Admitting: Obstetrics and Gynecology

## 2019-04-30 ENCOUNTER — Other Ambulatory Visit: Payer: Self-pay

## 2019-04-30 DIAGNOSIS — Z9889 Other specified postprocedural states: Secondary | ICD-10-CM

## 2019-04-30 DIAGNOSIS — O3413 Maternal care for benign tumor of corpus uteri, third trimester: Secondary | ICD-10-CM | POA: Diagnosis not present

## 2019-04-30 DIAGNOSIS — D259 Leiomyoma of uterus, unspecified: Secondary | ICD-10-CM | POA: Insufficient documentation

## 2019-04-30 DIAGNOSIS — O3410 Maternal care for benign tumor of corpus uteri, unspecified trimester: Secondary | ICD-10-CM | POA: Insufficient documentation

## 2019-04-30 NOTE — Telephone Encounter (Signed)
OB Telephone Note Patient called at [phone]. VM left (VM identified it as her's by name). I was able to get in touch with her GYN surgeon in [place] and he recommends c-section for delivery and left his cell [phone]) if any further questions  VM left with Paige Davidson that we recommend a c-section about 3wks before her due date and that we'll talk to her more about this at her next visit but if she has any questions in the mean time to let us know  Durene Romans MD Attending [facility] for [place] (Faculty Practice) 04/30/2019 Time: [phone]

## 2019-05-02 ENCOUNTER — Other Ambulatory Visit: Payer: Self-pay

## 2019-05-02 ENCOUNTER — Telehealth (INDEPENDENT_AMBULATORY_CARE_PROVIDER_SITE_OTHER): Payer: 59 | Admitting: Family Medicine

## 2019-05-02 ENCOUNTER — Encounter: Payer: Self-pay | Admitting: Family Medicine

## 2019-05-02 DIAGNOSIS — Z3A32 32 weeks gestation of pregnancy: Secondary | ICD-10-CM

## 2019-05-02 DIAGNOSIS — O26843 Uterine size-date discrepancy, third trimester: Secondary | ICD-10-CM

## 2019-05-02 DIAGNOSIS — O0993 Supervision of high risk pregnancy, unspecified, third trimester: Secondary | ICD-10-CM

## 2019-05-02 DIAGNOSIS — D259 Leiomyoma of uterus, unspecified: Secondary | ICD-10-CM

## 2019-05-02 DIAGNOSIS — N736 Female pelvic peritoneal adhesions (postinfective): Secondary | ICD-10-CM

## 2019-05-02 DIAGNOSIS — O3413 Maternal care for benign tumor of corpus uteri, third trimester: Secondary | ICD-10-CM

## 2019-05-02 DIAGNOSIS — O0990 Supervision of high risk pregnancy, unspecified, unspecified trimester: Secondary | ICD-10-CM

## 2019-05-02 DIAGNOSIS — O3410 Maternal care for benign tumor of corpus uteri, unspecified trimester: Secondary | ICD-10-CM

## 2019-05-02 NOTE — Progress Notes (Signed)
   PRENATAL VISIT NOTE  Subjective:  Paige Davidson is a 28 y.o. G2P0010 at [redacted]w[redacted]d being seen today for ongoing prenatal care.  She is currently monitored for the following issues for this high-risk pregnancy and has History of ectopic pregnancy; Supervision of high risk pregnancy, antepartum; Uterine fibroid during pregnancy, antepartum; Size > Dates; Pelvic adhesive disease; and History of myomectomy on their problem list.  Patient reports no complaints.  Contractions: Not present. Vag. Bleeding: None.  Movement: Present. Denies leaking of fluid.   The following portions of the patient's history were reviewed and updated as appropriate: allergies, current medications, past family history, past medical history, past social history, past surgical history and problem list.   Objective:  There were no vitals filed for this visit.  Fetal Status:     Movement: Present     General:  Alert, oriented and cooperative. Patient is in no acute distress.  Skin: Skin is warm and dry. No rash noted.   Cardiovascular: Normal heart rate noted  Respiratory: Normal respiratory effort, no problems with respiration noted  Abdomen: Soft, gravid, appropriate for gestational age.  Pain/Pressure: Present     Pelvic: Cervical exam deferred        Extremities: Normal range of motion.  Edema: None  Mental Status: Normal mood and affect. Normal behavior. Normal judgment and thought content.   Assessment and Plan:  Pregnancy: G2P0010 at [redacted]w[redacted]d 1. Supervision of high risk pregnancy, antepartum Up to date  2. Uterine fibroid during pregnancy, antepartum Reviewed need for 37 wk pCS, message sent to [place]. Desire clear drape  3. Pelvic adhesive disease Keep this in mind when performing CS  4. Size > Dates EFW wnl 65%  Preterm labor symptoms and general obstetric precautions including but not limited to vaginal bleeding, contractions, leaking of fluid and fetal movement were reviewed in detail with the  patient. Please refer to After Visit Summary for other counseling recommendations.   Return in about 2 weeks (around 05/16/2019) for Routine prenatal care.  Future Appointments  Date Time Provider [place]  05/16/2019  2:30 PM Caren Macadam, MD [facility]    Caren Macadam, MD

## 2019-05-02 NOTE — Progress Notes (Signed)
I connected with  Paige Davidson on 05/02/19 at  4:30 PM EDT by telephone and verified that I am speaking with the correct person using two identifiers.   I discussed the limitations, risks, security and privacy concerns of performing an evaluation and management service by telephone and the availability of in person appointments. I also discussed with the patient that there may be a patient responsible charge related to this service. The patient expressed understanding and agreed to proceed.  Crosby Oyster, RN 05/02/2019  4:37 PM

## 2019-05-16 ENCOUNTER — Encounter: Payer: Self-pay | Admitting: Family Medicine

## 2019-05-16 ENCOUNTER — Ambulatory Visit (INDEPENDENT_AMBULATORY_CARE_PROVIDER_SITE_OTHER): Payer: 59 | Admitting: Family Medicine

## 2019-05-16 ENCOUNTER — Telehealth (HOSPITAL_COMMUNITY): Payer: Self-pay | Admitting: *Deleted

## 2019-05-16 ENCOUNTER — Other Ambulatory Visit: Payer: Self-pay

## 2019-05-16 VITALS — BP 113/76 | HR 82 | Wt 223.4 lb

## 2019-05-16 DIAGNOSIS — O0993 Supervision of high risk pregnancy, unspecified, third trimester: Secondary | ICD-10-CM

## 2019-05-16 DIAGNOSIS — Z3A34 34 weeks gestation of pregnancy: Secondary | ICD-10-CM

## 2019-05-16 DIAGNOSIS — O0990 Supervision of high risk pregnancy, unspecified, unspecified trimester: Secondary | ICD-10-CM

## 2019-05-16 DIAGNOSIS — Z9889 Other specified postprocedural states: Secondary | ICD-10-CM

## 2019-05-16 DIAGNOSIS — O26843 Uterine size-date discrepancy, third trimester: Secondary | ICD-10-CM

## 2019-05-16 NOTE — Telephone Encounter (Signed)
Preadmission screen  

## 2019-05-16 NOTE — Patient Instructions (Signed)
Paige Davidson  05/16/2019   Your procedure is scheduled on:  05/31/2019  Arrive at [place] at Entrance C on [employer] at [facility]  and [employer]. You are invited to use the FREE valet parking or use the Visitor's parking deck.  Pick up the phone at the desk and dial [phone].  Call this number if you have problems the morning of surgery: [phone]  Remember:   Do not eat food:(After Midnight) Desps de medianoche.  Do not drink clear liquids: (After Midnight) Desps de medianoche.  Take these medicines the morning of surgery with A SIP OF WATER:  none   Do not wear jewelry, make-up or nail polish.  Do not wear lotions, powders, or perfumes. Do not wear deodorant.  Do not shave 48 hours prior to surgery.  Do not bring valuables to the hospital.  [facility] is not   responsible for any belongings or valuables brought to the hospital.  Contacts, dentures or bridgework may not be worn into surgery.  Leave suitcase in the car. After surgery it may be brought to your room.  For patients admitted to the hospital, checkout time is 11:00 AM the day of              discharge.      Please read over the following fact sheets that you were given:     Preparing for Surgery

## 2019-05-16 NOTE — Progress Notes (Signed)
   PRENATAL VISIT NOTE  Subjective:  Paige Davidson is a 28 y.o. G2P0010 at [redacted]w[redacted]d being seen today for ongoing prenatal care.  She is currently monitored for the following issues for this low-risk pregnancy and has History of ectopic pregnancy; Supervision of high risk pregnancy, antepartum; Uterine fibroid during pregnancy, antepartum; Size > Dates; Pelvic adhesive disease; and History of myomectomy on their problem list.  Patient reports no complaints.  Contractions: Not present.  .  Movement: Present. Denies leaking of fluid.   The following portions of the patient's history were reviewed and updated as appropriate: allergies, current medications, past family history, past medical history, past social history, past surgical history and problem list.   Objective:   Vitals:   05/16/19 1429  BP: 113/76  Pulse: 82  Weight: 223 lb 6.4 oz (101.3 kg)    Fetal Status: Fetal Heart Rate (bpm): 160   Movement: Present     General:  Alert, oriented and cooperative. Patient is in no acute distress.  Skin: Skin is warm and dry. No rash noted.   Cardiovascular: Normal heart rate noted  Respiratory: Normal respiratory effort, no problems with respiration noted  Abdomen: Soft, gravid, appropriate for gestational age.  Pain/Pressure: Present     Pelvic: Cervical exam deferred        Extremities: Normal range of motion.  Edema: Trace  Mental Status: Normal mood and affect. Normal behavior. Normal judgment and thought content.   Assessment and Plan:  Pregnancy: G2P0010 at [redacted]w[redacted]d 1. Supervision of high risk pregnancy, antepartum UTD  Has scheduled CS  2. Size > Dates Last US showed 65th%  3. History of myomectomy CS at 37 wks    Preterm labor symptoms and general obstetric precautions including but not limited to vaginal bleeding, contractions, leaking of fluid and fetal movement were reviewed in detail with the patient. Please refer to After Visit Summary for other counseling  recommendations.   Return in about 1 week (around 05/23/2019) for 36wks, Routine prenatal care.  Future Appointments  Date Time Provider [place]  05/28/2019  3:15 PM Sloan Leiter, MD [facility]  05/29/2019  8:50 AM [facility] 1 [facility] None    Caren Macadam, MD

## 2019-05-20 ENCOUNTER — Other Ambulatory Visit: Payer: Self-pay | Admitting: Family Medicine

## 2019-05-21 ENCOUNTER — Encounter (HOSPITAL_COMMUNITY): Payer: Self-pay

## 2019-05-28 ENCOUNTER — Encounter: Payer: Self-pay | Admitting: Obstetrics and Gynecology

## 2019-05-28 ENCOUNTER — Other Ambulatory Visit: Payer: Self-pay

## 2019-05-28 ENCOUNTER — Ambulatory Visit (INDEPENDENT_AMBULATORY_CARE_PROVIDER_SITE_OTHER): Payer: 59 | Admitting: Obstetrics and Gynecology

## 2019-05-28 VITALS — BP 110/75 | HR 79 | Wt 227.0 lb

## 2019-05-28 DIAGNOSIS — Z113 Encounter for screening for infections with a predominantly sexual mode of transmission: Secondary | ICD-10-CM

## 2019-05-28 DIAGNOSIS — O3413 Maternal care for benign tumor of corpus uteri, third trimester: Secondary | ICD-10-CM

## 2019-05-28 DIAGNOSIS — D259 Leiomyoma of uterus, unspecified: Secondary | ICD-10-CM

## 2019-05-28 DIAGNOSIS — Z3A36 36 weeks gestation of pregnancy: Secondary | ICD-10-CM

## 2019-05-28 DIAGNOSIS — O0990 Supervision of high risk pregnancy, unspecified, unspecified trimester: Secondary | ICD-10-CM

## 2019-05-28 DIAGNOSIS — O0993 Supervision of high risk pregnancy, unspecified, third trimester: Secondary | ICD-10-CM

## 2019-05-28 DIAGNOSIS — O3410 Maternal care for benign tumor of corpus uteri, unspecified trimester: Secondary | ICD-10-CM

## 2019-05-28 DIAGNOSIS — Z9889 Other specified postprocedural states: Secondary | ICD-10-CM

## 2019-05-28 NOTE — Patient Instructions (Signed)
AREA PEDIATRIC/FAMILY PRACTICE PHYSICIANS  [place] ([address]) . [facility] o Chambliss, MD; Eniola, MD; Hale, MD; Hensel, MD; McDiarmid, MD; McIntyer, MD; Neal, MD; Walden, MD o [address] [place], [place] [address] o [phone] o Mon-Fri 8:30-12:30, 1:30-5:00 o Providers come to see babies at [facility] o Accepting Medicaid . [facility] o Limited providers who accept newborns: Koirala, MD; Morrow, MD; Wolters, MD o [address], [place], [place] [address] o [phone] o Mon-Fri 8:00-5:30 o Babies seen by providers at [facility] o Does NOT accept Medicaid o Please call early in hospitalization for appointment (limited availability)  . [facility] o Mulberry, MD o [address] [place], [place] [address] o [phone] o Mon, Tue, Thur, Fri 8:30-5:00, Wed 10:00-7:00 (closed 1-2pm) o Babies seen by [facility] providers o Accepting Medicaid . [facility] o Rubin, MD o [address], [place], [place] [address] o [phone] o Mon-Fri 8:30-5:00, Sat 8:30-12:00 o Provider comes to see babies at [facility] o Accepting Medicaid o Must have been referred from current patients or contacted office prior to delivery . [facility] ([facility]) o Brown, MD; Chandler, MD; Ettefagh, MD; Grant, MD; Lester, MD; McCormick, MD; McQueen, MD; Prose, MD; Simha, MD; Stanley, MD; Stryffeler, NP; Tebben, NP o [address], [place], [place] [address] o [phone] o Mon, Tue, Thur, Fri 8:30-5:30, Wed 9:30-5:30, Sat 8:30-12:30 o Babies seen by [facility] providers o Accepting Medicaid o Only accepting infants of first-time parents or siblings of current patients o Hospital discharge coordinator will make follow-up appointment . Jack Amos o [address],  [place], [place]  [address] o [phone]   Fax - [phone] . [facility] o [address], [place], [place]  [address] o Phone - [phone]   Fax - [phone] . Shilpa Gosrani o [address], [place], [place]  [address] o [phone]  [place] ([address]) . [facility] o Bates, MD; [place]field, MD; Cooper, Cox, MD; MD; Lisvet Rasheed, MD; Dovico, MD; Ettefaugh, MD; Little, MD; Lowe, MD; Keiffer, MD; Melvin, MD; Sumner, MD; Williams, MD o [address], [place], [place] [address] o [phone] o Mon-Fri 8:30-5:00 (extended evenings Mon-Thur as needed), Sat-Sun 10:00-1:00 o Providers come to see babies at [facility] o Accepting Medicaid for families of first-time babies and families with all children in the household age 3 and under. Must register with office prior to making appointment (M-F only). . [facility] o Henson, NP; Knapp, MD; Lalonde, MD; Tysinger, PA o [address] [place], [place] [address] o [phone] o Mon-Fri 8:00-5:00 o Babies seen by providers at [facility] o Does NOT accept Medicaid/Commercial Insurance Only . [facility] ([facility])  o Henton, MD; Barnes, MD; Bratton, MD; Coccaro, MD; Lockett Gardner, MD; Kramer, MD; Marshall, MD; Netherton, MD; Poleto, MD; Skinner, MD o [address] [place], [place] [address] o [phone] o Mon-Fri 8:30-5:30, Sat (Oct.-Mar.) 9:00-1:00 o Babies seen by providers at [facility] o Accepting Medicaid  [place] ([address]) . [facility] o Reid, MD; Warner, MD o [address], [place], [place] [address] o [phone] o Mon-Fri 8:30-5:00, Sat 8:30-12:00 o Providers come to see babies at [facility] o Does NOT accept Medicaid . [facility] o Becker, PA; Hagler, MD; Scifres, PA; Sun, MD; Swayne, MD o [address],  [place], [place] [address] o [phone] o Mon-Fri 8:00-5:00 o Babies seen by providers at [facility] o Does NOT accept Medicaid o Only accepting babies of parents who are patients o Please call early in hospitalization for appointment (limited availability) . [facility] o Clark, MD; Frye, MD; Kelleher, MD; Mack, NP; Miller, MD; O'Keller, MD; Patterson, NP; Pudlo, MD; Puzio, MD; Thomas, MD; Tucker, MD; Twiselton, MD o [address], [place], [place] [address] o [phone] o Mon-Fri 8:00-5:00, Sat 9:00-12:00 o Providers come to see babies at [facility] o Does NOT accept Medicaid  [place] ([address]) . [facility] o Limited providers accepting new patients: Brake, NP; Wharton, PA o [address], [place], [place] [address] o [phone] o Mon-Fri 8:00-5:00 o Babies seen by providers at [facility] o Does NOT accept Medicaid o Only accepting babies of parents who are patients o Please call early in hospitalization for appointment (limited availability) . [facility] o Gay, MD; Quinlan, MD o [address] [place], [place] [address] o [phone] (press 1 to schedule appointment) o Mon-Fri 8:00-5:00 o Providers come to see babies at [facility] o Does NOT accept Medicaid . [facility] o Mazer, MD o [address] [place], [place] [address] o [phone] o Mon-Fri 8:30-5:00 (lunch 12:30-1:00), extended hours by appointment only Wed 5:00-6:30 o Babies seen by [facility] providers o Accepting Medicaid . [facility] o Banks, MD; Jordan, MD; Koberlein, MD o [address], [place], [place] [address] o [phone] o Mon-Fri 8:00-5:00 o Babies seen by [facility] providers o Does NOT accept Medicaid . [facility] o Parker, MD; Hunter, MD; Wallace, DO o [address] [place], [place]  [address] o [phone] o Mon-Fri 8:00-5:00 o Babies seen by [facility] providers o Does NOT accept Medicaid . [facility] o Brandon, PA; Brecken, PA; Christy, NP; Dees, MD; DeClaire, MD; DeWeese, MD; Hansen, NP; Mills, NP; Parrish, NP; Smoot, NP; Summer, MD; Vapne, MD o [address] [place], [place] [address] o [phone] o Mon-Fri 8:30-5:00, Sat 10:00-1:00 o Providers come to see babies at [facility] o Does NOT accept Medicaid o Free prenatal information session Tuesdays at 4:45pm . [facility] o Bouska, MD; Gordon, PA; Jeffery, PA; Weber, PA o [address] [place] [address] o [phone] o Mon-Fri 7:30-5:30 o Babies seen by [facility] providers . [facility] o [address], [place], [place]  [address] o [phone]   Fax - [phone]  [place] ([address] & [address]) . [facility] o Reese, MD o [address] [place], [place] [address] o [phone] o Mon-Thur 8:00-6:00 o Providers come to see babies at [facility] o Accepting Medicaid . [facility] o Anderson, NP; Badger, MD; Beal, PA; Spencer, PA o [address] [place], [place] [address] o [phone] o Mon-Thur 7:30-7:30, Fri 7:30-4:30 o Babies seen by [facility] providers o Accepting Medicaid . [facility] o Agbuya, MD; Klett, NP; Romgoolam, MD o [address], [place], [place] [address] o [phone] o Mon-Fri 8:30-5:00, Sat 8:30-12:00 o Providers come to see babies at [facility] o Accepting Medicaid o Must have "Meet & Greet" appointment at office prior to delivery . [facility]  [facility] ) o McCord,   MD; Wallace, MD; Wood, MD o [address], [place], [place] [address] o [phone] o Mon-Wed 8:00-6:00, Thur-Fri 8:00-5:00, Sat 9:00-12:00 o Providers come to  see babies at [facility] o Does NOT accept Medicaid o Only accepting siblings of current patients . [facility]  o [address], [place], [place]  [address] o [phone]   Fax - [phone] . [facility] o [address], [place], [place]  [address] o [phone]   Fax - [phone]  [place] ([address] & [address]) . [facility] o Cirigliano, DO; Matthews, DO o [address] [place], [place] [address] o [phone] o Mon-Fri 7:00-5:00 o Babies seen by [facility] providers o Does NOT accept Medicaid . [facility] o Briscoe, MD; Howley, PA; Moreira, PA o [address], [place], [place] [address] o [phone] o Mon-Fri 8:00-5:00 o Babies seen by [facility] providers o Accepting Medicaid . [facility] o Boyd, MD; Church, PA; Jones, NP; Osborn, PA o [address], [place], [place] [address] o [phone] o Mon-Fri 8:00-5:00 o Babies seen by providers at [facility] o Accepting Medicaid  [place] ([address]) . [facility] [place] o Wendling, DO o [address] [place], [place] [address] o [phone] o Mon-Fri 8:00-5:00 o Babies seen by [facility] providers o Does NOT accept Medicaid o Limited availability, please call early in hospitalization to schedule follow-up . [facility] o Calderon, PA; Cummings, MD; Dillard, MD; Martin, PA; Olson, MD; VanDeven, PA o [address], [place], [place] [address] o [phone] o Mon-Fri 8:30-5:00, Sat 9:00-12:00 o Babies seen by providers at [facility] o Accepting Medicaid o Please register online then schedule online or call office o [email or web address] . [facility] o Hunter, NP; Kumar, MD; Martin Rogers, PA o [address], [place], [place] [address] o [phone] o Mon-Fri 8:00-5:00 o Babies seen by providers at [facility] o Accepting Medicaid . [facility] ([facility] o Benbrook, MD; Kristi Fleenor, NP; West, MD o [address], [place], [place] [address] o [phone] o Mon-Fri 8:00-5:30, Sat&Sun by appointment (phones open at 8:30) o Babies seen by [facility] providers o Accepting Medicaid o Must be a first-time baby or sibling of current patient . [facility] [place]  o [address], [place], [place]  [address] o [phone]   Fax - [phone]  [place] ([address]) . [facility] o Brown, PA; Cowen, PA; Rice, MD; Helton, PA; Spry, MD o [address] [place], [place] [address] o [phone] o Mon-Thur 8:00-7:00, Fri 8:00-5:00, Sat 8:00-12:00, Sun 9:00-12:00 o Babies seen by [facility] providers o Accepting Medicaid . [facility] o Coe-Goins, MD; Marshall, MD; Pierre-Louis, MD o [address] [place] [address], [place], [place] [address] o [phone] o Mon-Thur 8:00-5:00 o Babies seen by providers at [facility] o Accepting Medicaid . [facility] o Bratton, MD; Coe-Goins, MD; Hayes, MD; Lewis, MD; List, MD; Lott, MD; Marshall, MD; Moran, MD; O'Neal, MD; Pierre-Louis, MD; Pitonzo, MD; Scholer, MD; Spangle, MD o [address] [place], [place]   [address] o [phone] o Mon-Fri 8:00-5:30, Sat (Oct.-Mar.) 9:00-1:00 o Babies seen by providers at [facility] o Accepting Medicaid o Must fill out new patient packet, available online at [email or web address] . [facility] ([facility] o Friddle, NP; Harris, NP; Annalyce Lanpher, NP; Logan, MD;  Melvin, PA; Poth, MD; Ramadoss, MD; Stanton, NP o [address], [place], [place] [address] o [phone] o Mon-Thur 8:00-5:30, Fri 8:00-5:00 o Babies seen by providers at [facility] o Accepting Medicaid  [place] ([address]) . [facility] o Dixon, PA; [place], MD; Pickard, MD; Tapia, PA o [address], [place], [place] [address] o [phone] o Mon-Fri 8:00-5:00 o Babies seen by providers at [facility] o Accepting Medicaid   [place] ([address]) . [facility] [place] o Masneri, DO; Meyers, MD; Nelson, PA o [address], [place], [place] [address] o [phone] o Mon-Fri 8:00-5:00 o Babies seen by providers at [facility] o Does NOT accept Medicaid o Limited appointment availability, please call early in hospitalization  . [facility] [place] o Kunedd, DO; McGowen, MD o [address], [place], [place] [address] o [phone] o Mon-Fri 8:00-5:00 o Babies seen by [facility] providers o Does NOT accept Medicaid . [facility] [place] o Cameron, MD; MacDonald, MD; Michaels, PA; Nayak, MD o [address] [place] [address], [place], [place] [address] o [phone] o Mon-Fri 8:00-5:00 o After hours clinic ([address] [place], [place] [address]) [phone] Mon-Fri 5:00-8:00, Sat 12:00-6:00, Sun 10:00-4:00 o Babies seen by [facility] providers o Accepting Medicaid . [facility] [place] o [address], [place], [place]  [address] o [phone]   Fax - [phone]  [place] ([address]) . [facility] [place] [facility] o Andy, MD o [address], [place], [place] [address] o [phone] o Mon-Fri 8:00-5:00 o Babies seen by [facility] providers o Does NOT accept Medicaid . [facility] [place] ([facility] [place]) o Eksir, MD o [address], [place], [place]  [address] o [phone] o Mon-Thur 8:00-7:00, Fri 8:00-5:00, Sat 8:00-12:00 o Babies seen by providers at [facility] o Accepting Medicaid - but does not have vaccinations in office (must be received elsewhere) o Limited availability, please call early in hospitalization  [place] ([address]) . [facility]  o Charlene Flemming, MD o [address], [place] [address] o [phone]  Fax [phone]   

## 2019-05-28 NOTE — Progress Notes (Signed)
   PRENATAL VISIT NOTE  Subjective:  Paige Davidson is a 28 y.o. G2P0010 at [redacted]w[redacted]d being seen today for ongoing prenatal care.  She is currently monitored for the following issues for this high-risk pregnancy and has History of ectopic pregnancy; Supervision of high risk pregnancy, antepartum; Uterine fibroid during pregnancy, antepartum; Size > Dates; Pelvic adhesive disease; and History of myomectomy on their problem list.  Patient reports vaginal pressure.  Contractions: Not present. Vag. Bleeding: None.  Movement: Present. Denies leaking of fluid.   The following portions of the patient's history were reviewed and updated as appropriate: allergies, current medications, past family history, past medical history, past social history, past surgical history and problem list.   Objective:   Vitals:   05/28/19 1509  BP: 110/75  Pulse: 79  Weight: 227 lb (103 kg)    Fetal Status: Fetal Heart Rate (bpm): 148   Movement: Present     General:  Alert, oriented and cooperative. Patient is in no acute distress.  Skin: Skin is warm and dry. No rash noted.   Cardiovascular: Normal heart rate noted  Respiratory: Normal respiratory effort, no problems with respiration noted  Abdomen: Soft, gravid, appropriate for gestational age.  Pain/Pressure: Present     Pelvic: Cervical exam deferred        Extremities: Normal range of motion.  Edema: None  Mental Status: Normal mood and affect. Normal behavior. Normal judgment and thought content.   Assessment and Plan:  Pregnancy: G2P0010 at [redacted]w[redacted]d  1. Supervision of high risk pregnancy, antepartum - Culture, beta strep (group b only) - GC/Chlamydia probe amp ([place])not at [facility]  2. History of myomectomy Scheduled for CS at 37 weeks for Thursday  3. Uterine fibroid during pregnancy, antepartum   Preterm labor symptoms and general obstetric precautions including but not limited to vaginal bleeding, contractions, leaking of fluid and fetal  movement were reviewed in detail with the patient. Please refer to After Visit Summary for other counseling recommendations.   Return in about 5 weeks (around 07/02/2019) for post partum check.  Future Appointments  Date Time Provider [place]  05/29/2019  8:50 AM [facility]-MAU 1 [facility] None    Sloan Leiter, MD

## 2019-05-29 ENCOUNTER — Other Ambulatory Visit (HOSPITAL_COMMUNITY)
Admission: RE | Admit: 2019-05-29 | Discharge: 2019-05-29 | Disposition: A | Discharge status: Home / Self Care | Payer: 59 | Source: Ambulatory Visit | Attending: Obstetrics & Gynecology | Admitting: Obstetrics & Gynecology

## 2019-05-29 DIAGNOSIS — Z01812 Encounter for preprocedural laboratory examination: Secondary | ICD-10-CM | POA: Insufficient documentation

## 2019-05-29 DIAGNOSIS — Z20828 Contact with and (suspected) exposure to other viral communicable diseases: Secondary | ICD-10-CM | POA: Insufficient documentation

## 2019-05-29 HISTORY — DX: Unspecified hearing loss, left ear: H91.92

## 2019-05-29 LAB — CBC
HCT: 34.5 % — ABNORMAL LOW (ref 36.0–46.0)
Hemoglobin: 12.3 g/dL (ref 12.0–15.0)
MCH: 32.7 pg (ref 26.0–34.0)
MCHC: 35.7 g/dL (ref 30.0–36.0)
MCV: 91.8 fL (ref 80.0–100.0)
Platelets: 262 10*3/uL (ref 150–400)
RBC: 3.76 MIL/uL — ABNORMAL LOW (ref 3.87–5.11)
RDW: 12.6 % (ref 11.5–15.5)
WBC: 9 10*3/uL (ref 4.0–10.5)
nRBC: 0 % (ref 0.0–0.2)

## 2019-05-29 LAB — RPR: RPR Ser Ql: NONREACTIVE

## 2019-05-29 LAB — SARS CORONAVIRUS 2 BY RT PCR (HOSPITAL ORDER, PERFORMED IN ~~LOC~~ HOSPITAL LAB): SARS Coronavirus 2: NEGATIVE

## 2019-05-29 LAB — TYPE AND SCREEN
ABO/RH(D): B POS
Antibody Screen: NEGATIVE

## 2019-05-29 NOTE — MAU Note (Signed)
Asymptomatic, swab collected. Waiting on lab 

## 2019-05-31 ENCOUNTER — Inpatient Hospital Stay (HOSPITAL_COMMUNITY): Payer: 59 | Admitting: Anesthesiology

## 2019-05-31 ENCOUNTER — Other Ambulatory Visit: Payer: Self-pay

## 2019-05-31 ENCOUNTER — Inpatient Hospital Stay (HOSPITAL_COMMUNITY)
Admission: RE | Admit: 2019-05-31 | Discharge: 2019-06-02 | DRG: 788 | Disposition: A | Discharge status: Home / Self Care | Payer: 59 | Attending: Obstetrics and Gynecology | Admitting: Obstetrics and Gynecology

## 2019-05-31 ENCOUNTER — Encounter (HOSPITAL_COMMUNITY)
Admission: RE | Disposition: A | Discharge status: Home / Self Care | Payer: Self-pay | Source: Home / Self Care | Attending: Obstetrics and Gynecology

## 2019-05-31 ENCOUNTER — Encounter (HOSPITAL_COMMUNITY): Payer: Self-pay | Admitting: *Deleted

## 2019-05-31 DIAGNOSIS — O3413 Maternal care for benign tumor of corpus uteri, third trimester: Secondary | ICD-10-CM | POA: Diagnosis present

## 2019-05-31 DIAGNOSIS — Z3A37 37 weeks gestation of pregnancy: Secondary | ICD-10-CM | POA: Diagnosis not present

## 2019-05-31 DIAGNOSIS — Z20828 Contact with and (suspected) exposure to other viral communicable diseases: Secondary | ICD-10-CM | POA: Diagnosis present

## 2019-05-31 DIAGNOSIS — D259 Leiomyoma of uterus, unspecified: Secondary | ICD-10-CM | POA: Diagnosis present

## 2019-05-31 DIAGNOSIS — N736 Female pelvic peritoneal adhesions (postinfective): Secondary | ICD-10-CM | POA: Diagnosis present

## 2019-05-31 DIAGNOSIS — O3429 Maternal care due to uterine scar from other previous surgery: Secondary | ICD-10-CM | POA: Diagnosis present

## 2019-05-31 DIAGNOSIS — Z880 Allergy status to penicillin: Secondary | ICD-10-CM | POA: Diagnosis not present

## 2019-05-31 DIAGNOSIS — Z9889 Other specified postprocedural states: Secondary | ICD-10-CM

## 2019-05-31 DIAGNOSIS — O3410 Maternal care for benign tumor of corpus uteri, unspecified trimester: Secondary | ICD-10-CM | POA: Diagnosis present

## 2019-05-31 DIAGNOSIS — O0991 Supervision of high risk pregnancy, unspecified, first trimester: Secondary | ICD-10-CM

## 2019-05-31 DIAGNOSIS — Z98891 History of uterine scar from previous surgery: Secondary | ICD-10-CM

## 2019-05-31 DIAGNOSIS — O0990 Supervision of high risk pregnancy, unspecified, unspecified trimester: Secondary | ICD-10-CM

## 2019-05-31 HISTORY — DX: Maternal care due to uterine scar from other previous surgery: O34.29

## 2019-05-31 LAB — CBC
HCT: 33.8 % — ABNORMAL LOW (ref 36.0–46.0)
Hemoglobin: 11.8 g/dL — ABNORMAL LOW (ref 12.0–15.0)
MCH: 32 pg (ref 26.0–34.0)
MCHC: 34.9 g/dL (ref 30.0–36.0)
MCV: 91.6 fL (ref 80.0–100.0)
Platelets: 243 10*3/uL (ref 150–400)
RBC: 3.69 MIL/uL — ABNORMAL LOW (ref 3.87–5.11)
RDW: 12.5 % (ref 11.5–15.5)
WBC: 14.3 10*3/uL — ABNORMAL HIGH (ref 4.0–10.5)
nRBC: 0 % (ref 0.0–0.2)

## 2019-05-31 LAB — GC/CHLAMYDIA PROBE AMP (~~LOC~~) NOT AT ARMC
Chlamydia: NEGATIVE
Comment: NEGATIVE
Comment: NORMAL
Neisseria Gonorrhea: NEGATIVE

## 2019-05-31 LAB — CREATININE, SERUM
Creatinine, Ser: 0.65 mg/dL (ref 0.44–1.00)
GFR calc Af Amer: >60 mL/min (ref >60)
GFR calc non Af Amer: >60 mL/min (ref >60)

## 2019-05-31 SURGERY — Surgical Case
Anesthesia: Spinal

## 2019-05-31 MED ORDER — ACETAMINOPHEN 160 MG/5ML PO SOLN: 1000.0000 mg | Freq: Once | ORAL | Status: DC

## 2019-05-31 MED ORDER — SIMETHICONE 80 MG PO CHEW
80.0000 mg | CHEWABLE_TABLET | Freq: Three times a day (TID) | ORAL | Status: DC
  Administered 2019-05-31 – 2019-06-02 (×6): 80 mg via ORAL
  Filled 2019-05-31 (×6): qty 1

## 2019-05-31 MED ORDER — KETOROLAC TROMETHAMINE 30 MG/ML IJ SOLN
INTRAMUSCULAR | Status: AC
  Filled 2019-05-31: qty 1

## 2019-05-31 MED ORDER — MORPHINE SULFATE (PF) 0.5 MG/ML IJ SOLN
INTRAMUSCULAR | Status: AC
  Filled 2019-05-31: qty 10

## 2019-05-31 MED ORDER — FENTANYL CITRATE (PF) 100 MCG/2ML IJ SOLN
INTRAMUSCULAR | Status: AC
  Filled 2019-05-31: qty 2

## 2019-05-31 MED ORDER — PROMETHAZINE HCL 25 MG/ML IJ SOLN: 6.2500 mg | INTRAMUSCULAR | Status: DC | PRN

## 2019-05-31 MED ORDER — MORPHINE SULFATE (PF) 0.5 MG/ML IJ SOLN
INTRAMUSCULAR | Status: DC | PRN
  Administered 2019-05-31: .15 mg via INTRATHECAL

## 2019-05-31 MED ORDER — MENTHOL 3 MG MT LOZG: 1.0000 | LOZENGE | OROMUCOSAL | Status: DC | PRN

## 2019-05-31 MED ORDER — TETANUS-DIPHTH-ACELL PERTUSSIS 5-2.5-18.5 LF-MCG/0.5 IM SUSP: 0.5000 mL | Freq: Once | INTRAMUSCULAR | Status: DC

## 2019-05-31 MED ORDER — FENTANYL CITRATE (PF) 100 MCG/2ML IJ SOLN: 25.0000 ug | INTRAMUSCULAR | Status: DC | PRN

## 2019-05-31 MED ORDER — PHENYLEPHRINE HCL-NACL 20-0.9 MG/250ML-% IV SOLN
INTRAVENOUS | Status: DC | PRN
  Administered 2019-05-31: 60 ug/min via INTRAVENOUS

## 2019-05-31 MED ORDER — DIPHENHYDRAMINE HCL 25 MG PO CAPS: 25.0000 mg | ORAL_CAPSULE | Freq: Four times a day (QID) | ORAL | Status: DC | PRN

## 2019-05-31 MED ORDER — CLINDAMYCIN PHOSPHATE 300 MG/50ML IV SOLN
INTRAVENOUS | Status: AC
  Filled 2019-05-31: qty 50

## 2019-05-31 MED ORDER — PRENATAL MULTIVITAMIN CH
1.0000 | ORAL_TABLET | Freq: Every day | ORAL | Status: DC
  Administered 2019-06-01 – 2019-06-02 (×2): 1 via ORAL
  Filled 2019-05-31 (×2): qty 1

## 2019-05-31 MED ORDER — LACTATED RINGERS IV SOLN
INTRAVENOUS | Status: DC | PRN
  Administered 2019-05-31 (×2): via INTRAVENOUS

## 2019-05-31 MED ORDER — IBUPROFEN 800 MG PO TABS
800.0000 mg | ORAL_TABLET | Freq: Three times a day (TID) | ORAL | Status: DC
  Administered 2019-05-31 – 2019-06-02 (×6): 800 mg via ORAL
  Filled 2019-05-31 (×6): qty 1

## 2019-05-31 MED ORDER — LACTATED RINGERS IV SOLN
INTRAVENOUS | Status: DC
  Administered 2019-05-31 – 2019-06-01 (×2): via INTRAVENOUS

## 2019-05-31 MED ORDER — SENNOSIDES-DOCUSATE SODIUM 8.6-50 MG PO TABS
2.0000 | ORAL_TABLET | ORAL | Status: DC
  Administered 2019-05-31 – 2019-06-01 (×2): 2 via ORAL
  Filled 2019-05-31 (×2): qty 2

## 2019-05-31 MED ORDER — SODIUM CHLORIDE 0.9 % IV SOLN
INTRAVENOUS | Status: DC | PRN
  Administered 2019-05-31: 10:00:00 via INTRAVENOUS

## 2019-05-31 MED ORDER — COCONUT OIL OIL: 1.0000 "application " | TOPICAL_OIL | Status: DC | PRN

## 2019-05-31 MED ORDER — OXYTOCIN 40 UNITS IN NORMAL SALINE INFUSION - SIMPLE MED
INTRAVENOUS | Status: DC | PRN
  Administered 2019-05-31: 40 [IU] via INTRAVENOUS

## 2019-05-31 MED ORDER — CLINDAMYCIN PHOSPHATE 900 MG/50ML IV SOLN
900.0000 mg | INTRAVENOUS | Status: AC
  Administered 2019-05-31: 900 mg via INTRAVENOUS

## 2019-05-31 MED ORDER — ACETAMINOPHEN 500 MG PO TABS: 1000.0000 mg | ORAL_TABLET | Freq: Once | ORAL | Status: DC

## 2019-05-31 MED ORDER — DEXAMETHASONE SODIUM PHOSPHATE 4 MG/ML IJ SOLN
INTRAMUSCULAR | Status: AC
  Filled 2019-05-31: qty 1

## 2019-05-31 MED ORDER — GABAPENTIN 100 MG PO CAPS
100.0000 mg | ORAL_CAPSULE | Freq: Three times a day (TID) | ORAL | Status: DC
  Administered 2019-05-31 – 2019-06-02 (×6): 100 mg via ORAL
  Filled 2019-05-31 (×6): qty 1

## 2019-05-31 MED ORDER — NALOXONE HCL 4 MG/10ML IJ SOLN
1.0000 ug/kg/h | INTRAVENOUS | Status: DC | PRN
  Filled 2019-05-31: qty 5

## 2019-05-31 MED ORDER — OXYTOCIN 40 UNITS IN NORMAL SALINE INFUSION - SIMPLE MED: 2.5000 [IU]/h | INTRAVENOUS | Status: AC

## 2019-05-31 MED ORDER — SIMETHICONE 80 MG PO CHEW: 80.0000 mg | CHEWABLE_TABLET | ORAL | Status: DC | PRN

## 2019-05-31 MED ORDER — SODIUM CHLORIDE 0.9% FLUSH: 3.0000 mL | INTRAVENOUS | Status: DC | PRN

## 2019-05-31 MED ORDER — GENTAMICIN SULFATE 40 MG/ML IJ SOLN
5.0000 mg/kg | INTRAVENOUS | Status: AC
  Administered 2019-05-31: 380 mg via INTRAVENOUS
  Filled 2019-05-31: qty 9.5

## 2019-05-31 MED ORDER — ONDANSETRON HCL 4 MG/2ML IJ SOLN
INTRAMUSCULAR | Status: DC | PRN
  Administered 2019-05-31: 4 mg via INTRAVENOUS

## 2019-05-31 MED ORDER — DEXAMETHASONE SODIUM PHOSPHATE 4 MG/ML IJ SOLN
INTRAMUSCULAR | Status: DC | PRN
  Administered 2019-05-31: 4 mg via INTRAVENOUS

## 2019-05-31 MED ORDER — DIPHENHYDRAMINE HCL 50 MG/ML IJ SOLN: 12.5000 mg | INTRAMUSCULAR | Status: DC | PRN

## 2019-05-31 MED ORDER — PHENYLEPHRINE HCL-NACL 20-0.9 MG/250ML-% IV SOLN
INTRAVENOUS | Status: AC
  Filled 2019-05-31: qty 250

## 2019-05-31 MED ORDER — BUPIVACAINE IN DEXTROSE 0.75-8.25 % IT SOLN
INTRATHECAL | Status: DC | PRN
  Administered 2019-05-31: 1.7 mL via INTRATHECAL

## 2019-05-31 MED ORDER — NALBUPHINE HCL 10 MG/ML IJ SOLN
5.0000 mg | INTRAMUSCULAR | Status: DC | PRN
  Filled 2019-05-31: qty 0.5

## 2019-05-31 MED ORDER — DIBUCAINE (PERIANAL) 1 % EX OINT: 1.0000 "application " | TOPICAL_OINTMENT | CUTANEOUS | Status: DC | PRN

## 2019-05-31 MED ORDER — WITCH HAZEL-GLYCERIN EX PADS: 1.0000 "application " | MEDICATED_PAD | CUTANEOUS | Status: DC | PRN

## 2019-05-31 MED ORDER — KETOROLAC TROMETHAMINE 30 MG/ML IJ SOLN
30.0000 mg | Freq: Once | INTRAMUSCULAR | Status: AC
  Administered 2019-05-31: 30 mg via INTRAVENOUS

## 2019-05-31 MED ORDER — KETOROLAC TROMETHAMINE 30 MG/ML IJ SOLN: 30.0000 mg | Freq: Four times a day (QID) | INTRAMUSCULAR | Status: AC | PRN

## 2019-05-31 MED ORDER — ACETAMINOPHEN 500 MG PO TABS
1000.0000 mg | ORAL_TABLET | Freq: Four times a day (QID) | ORAL | Status: AC
  Administered 2019-05-31 – 2019-06-01 (×3): 1000 mg via ORAL
  Filled 2019-05-31 (×3): qty 2

## 2019-05-31 MED ORDER — SODIUM CHLORIDE 0.9 % IR SOLN
Status: DC | PRN
  Administered 2019-05-31: 1

## 2019-05-31 MED ORDER — OXYCODONE-ACETAMINOPHEN 5-325 MG PO TABS
1.0000 | ORAL_TABLET | ORAL | Status: DC | PRN
  Administered 2019-06-01: 1 via ORAL
  Administered 2019-06-01 – 2019-06-02 (×2): 2 via ORAL
  Filled 2019-05-31 (×2): qty 2
  Filled 2019-05-31: qty 1

## 2019-05-31 MED ORDER — SIMETHICONE 80 MG PO CHEW
80.0000 mg | CHEWABLE_TABLET | ORAL | Status: DC
  Administered 2019-05-31 – 2019-06-01 (×2): 80 mg via ORAL
  Filled 2019-05-31 (×2): qty 1

## 2019-05-31 MED ORDER — NALBUPHINE HCL 10 MG/ML IJ SOLN
5.0000 mg | Freq: Once | INTRAMUSCULAR | Status: DC | PRN
  Filled 2019-05-31: qty 0.5

## 2019-05-31 MED ORDER — ENOXAPARIN SODIUM 60 MG/0.6ML ~~LOC~~ SOLN
50.0000 mg | SUBCUTANEOUS | Status: DC
  Administered 2019-06-01 – 2019-06-02 (×2): 50 mg via SUBCUTANEOUS
  Filled 2019-05-31 (×2): qty 0.6

## 2019-05-31 MED ORDER — OXYTOCIN 40 UNITS IN NORMAL SALINE INFUSION - SIMPLE MED
INTRAVENOUS | Status: AC
  Filled 2019-05-31: qty 1000

## 2019-05-31 MED ORDER — CLINDAMYCIN PHOSPHATE 900 MG/50ML IV SOLN
INTRAVENOUS | Status: AC
  Filled 2019-05-31: qty 50

## 2019-05-31 MED ORDER — ONDANSETRON HCL 4 MG/2ML IJ SOLN
INTRAMUSCULAR | Status: AC
  Filled 2019-05-31: qty 2

## 2019-05-31 MED ORDER — FENTANYL CITRATE (PF) 100 MCG/2ML IJ SOLN
INTRAMUSCULAR | Status: DC | PRN
  Administered 2019-05-31: 15 ug via INTRATHECAL

## 2019-05-31 MED ORDER — ONDANSETRON HCL 4 MG/2ML IJ SOLN: 4.0000 mg | Freq: Three times a day (TID) | INTRAMUSCULAR | Status: DC | PRN

## 2019-05-31 MED ORDER — NALOXONE HCL 0.4 MG/ML IJ SOLN: 0.4000 mg | INTRAMUSCULAR | Status: DC | PRN

## 2019-05-31 MED ORDER — DIPHENHYDRAMINE HCL 25 MG PO CAPS: 25.0000 mg | ORAL_CAPSULE | ORAL | Status: DC | PRN

## 2019-05-31 SURGICAL SUPPLY — 32 items
BENZOIN TINCTURE PRP APPL 2/3 (GAUZE/BANDAGES/DRESSINGS) ×2 IMPLANT
CHLORAPREP W/TINT 26ML (MISCELLANEOUS) ×2 IMPLANT
CLAMP CORD UMBIL (MISCELLANEOUS) IMPLANT
CLSR STERI-STRIP ANTIMIC 1/2X4 (GAUZE/BANDAGES/DRESSINGS) ×2 IMPLANT
DRSG OPSITE POSTOP 4X10 (GAUZE/BANDAGES/DRESSINGS) ×2 IMPLANT
ELECT REM PT RETURN 9FT ADLT (ELECTROSURGICAL) ×2
ELECTRODE REM PT RTRN 9FT ADLT (ELECTROSURGICAL) ×1 IMPLANT
EXTRACTOR VACUUM M CUP 4 TUBE (SUCTIONS) IMPLANT
GLOVE BIOGEL PI IND STRL 6.5 (GLOVE) ×1 IMPLANT
GLOVE BIOGEL PI IND STRL 7.0 (GLOVE) ×1 IMPLANT
GLOVE BIOGEL PI INDICATOR 6.5 (GLOVE) ×1
GLOVE BIOGEL PI INDICATOR 7.0 (GLOVE) ×1
GLOVE SURG SS PI 6.0 STRL IVOR (GLOVE) ×2 IMPLANT
GOWN STRL REUS W/TWL LRG LVL3 (GOWN DISPOSABLE) ×4 IMPLANT
KIT ABG SYR 3ML LUER SLIP (SYRINGE) IMPLANT
NEEDLE HYPO 25X5/8 SAFETYGLIDE (NEEDLE) IMPLANT
NS IRRIG 1000ML POUR BTL (IV SOLUTION) ×2 IMPLANT
PACK C SECTION WH (CUSTOM PROCEDURE TRAY) ×2 IMPLANT
PAD ABD 7.5X8 STRL (GAUZE/BANDAGES/DRESSINGS) ×4 IMPLANT
PAD OB MATERNITY 4.3X12.25 (PERSONAL CARE ITEMS) ×2 IMPLANT
PENCIL SMOKE EVAC W/HOLSTER (ELECTROSURGICAL) ×2 IMPLANT
RTRCTR C-SECT PINK 25CM LRG (MISCELLANEOUS) IMPLANT
SEPRAFILM MEMBRANE 5X6 (MISCELLANEOUS) IMPLANT
SPONGE GAUZE 4X4 12PLY STER LF (GAUZE/BANDAGES/DRESSINGS) ×2 IMPLANT
SUT PLAIN 0 NONE (SUTURE) IMPLANT
SUT PLAIN 2 0 (SUTURE) ×1
SUT PLAIN ABS 2-0 CT1 27XMFL (SUTURE) ×1 IMPLANT
SUT VIC AB 0 CT1 36 (SUTURE) ×8 IMPLANT
SUT VIC AB 4-0 KS 27 (SUTURE) ×2 IMPLANT
TOWEL OR 17X24 6PK STRL BLUE (TOWEL DISPOSABLE) ×2 IMPLANT
TRAY FOLEY W/BAG SLVR 14FR LF (SET/KITS/TRAYS/PACK) ×2 IMPLANT
WATER STERILE IRR 1000ML POUR (IV SOLUTION) ×2 IMPLANT

## 2019-05-31 NOTE — Discharge Summary (Signed)
Postpartum Discharge Summary     Patient Name: Paige Davidson DOB: [birth year] MRN: [id number]  Date of admission: 05/31/2019 Delivering Provider: CONSTANT, PEGGY   Date of discharge: 06/02/2019  Admitting diagnosis: PCS H-O Myomectomy Intrauterine pregnancy: [redacted]w[redacted]d    Secondary diagnosis:  Active Problems:   Supervision of high risk pregnancy, antepartum   Uterine fibroid during pregnancy, antepartum   Pelvic adhesive disease   History of myomectomy   Pregnancy with history of uterine myomectomy   Encounter for cesarean delivery without indication   Status post primary low transverse cesarean section  Additional problems: None     Discharge diagnosis: Term Pregnancy Delivered                                                                                                Post partum procedures:None  Augmentation: n/a  Complications: None  Hospital course:  Sceduled C/S   28y.o. yo G2P0010 at 365w0das admitted to the hospital 05/31/2019 for scheduled cesarean section with the following indication:Prior Uterine Surgery.  Membrane Rupture Time/Date: 10:06 AM ,05/31/2019   Patient delivered a Viable infant.05/31/2019  Details of operation can be found in separate operative note.  Pateint had an uncomplicated postpartum course.  She is ambulating, tolerating a regular diet, passing flatus, and urinating well. Patient is discharged home in stable condition on  06/02/19        Delivery time: 10:07 AM    Magnesium Sulfate received: No BMZ received: No Rhophylac:N/A MMR:N/A Transfusion:No  Physical exam  Vitals:   06/01/19 0520 06/01/19 1010 06/01/19 2331 06/02/19 0600  BP: 112/74 110/70 117/81 118/81  Pulse: 72 60 60 90  Resp: '16 17 16 16  ' Temp: 99.4 F (37.4 C) 98.8 F (37.1 C) 98.4 F (36.9 C) 98.2 F (36.8 C)  TempSrc: Oral Oral Oral Oral  SpO2: 100% 100% 99% 100%  Weight:      Height:       General: alert, cooperative and no distress Lochia:  appropriate Uterine Fundus: firm Incision: No drainage, dressing 2/3 saturated with dark blood, marked 24 hours ago and no additional bleeding DVT Evaluation: No evidence of DVT seen on physical exam. Labs: Lab Results  Component Value Date   WBC 12.2 (H) 06/01/2019   HGB 10.7 (L) 06/01/2019   HCT 31.9 (L) 06/01/2019   MCV 94.4 06/01/2019   PLT 256 06/01/2019   CMP Latest Ref Rng & Units 05/31/2019  Glucose 70 - 99 mg/dL -  BUN 6 - 20 mg/dL -  Creatinine 0.44 - 1.00 mg/dL 0.65  Sodium 135 - 145 mmol/L -  Potassium 3.5 - 5.1 mmol/L -  Chloride 98 - 111 mmol/L -  CO2 22 - 32 mmol/L -  Calcium 8.9 - 10.3 mg/dL -  Total Protein 6.5 - 8.1 g/dL -  Total Bilirubin 0.3 - 1.2 mg/dL -  Alkaline Phos 38 - 126 U/L -  AST 15 - 41 U/L -  ALT 0 - 44 U/L -    Discharge instruction: per After Visit Summary and "Baby and Me Booklet".  After visit meds:  Allergies as  of 06/02/2019      Reactions   Penicillins Hives   Did it involve swelling of the face/tongue/throat, SOB, or low BP? No Did it involve sudden or severe rash/hives, skin peeling, or any reaction on the inside of your mouth or nose? Yes Did you need to seek medical attention at a hospital or doctor's office? Unknown When did it last happen?5+ years If all above answers are "NO", may proceed with cephalosporin use.      Medication List    TAKE these medications   acetaminophen 500 MG tablet Commonly known as: TYLENOL Take 500 mg by mouth every 6 (six) hours as needed for moderate pain.   Concept OB 130-92.4-1 MG Caps Take 1 capsule by mouth daily.   diphenhydramine-acetaminophen 25-500 MG Tabs tablet Commonly known as: TYLENOL PM Take 1 tablet by mouth at bedtime as needed (SLEEP).   doxylamine (Sleep) 25 MG tablet Commonly known as: UNISOM Take 25 mg by mouth at bedtime as needed for sleep.   ibuprofen 800 MG tablet Commonly known as: ADVIL Take 1 tablet (800 mg total) by mouth every 8 (eight) hours.    oxyCODONE-acetaminophen 5-325 MG tablet Commonly known as: PERCOCET/ROXICET Take 2 tablets by mouth every 6 (six) hours as needed for moderate pain.       Diet: routine diet  Activity: Advance as tolerated. Pelvic rest for 6 weeks.   Outpatient follow up:2 weeks Follow up Appt: Future Appointments  Date Time Provider [place]  06/14/2019  2:30 PM [facility] NURSE [facility]  07/04/2019  2:30 PM Darlina Rumpf, CNM [facility]   Follow up Visit: [place] for [place] [facility] Follow up.   Specialty: Obstetrics and Gynecology Why: For incision check in 2 weeks and postpartum visit in 4-6 weeks as scheduled Contact information: [place] [phone]           Please schedule this patient for Postpartum visit in: 6 weeks with the following provider: Any provider For C/S patients schedule nurse incision check in weeks 2 weeks: yes High risk pregnancy complicated by: history of prior myomectomy Delivery mode:  CS Anticipated Birth Control:  OCPs PP Procedures needed: Incision check  Schedule Integrated BH visit: no      Newborn Data: Live born female  Birth Weight:  3235 g, 7lb 2.1 oz APGAR: 8, 9  Newborn Delivery   Birth date/time: 05/31/2019 10:07:00 Delivery type: C-Section, Low Transverse Trial of labor: No C-section categorization: Primary      Baby Feeding: Bottle and Breast Disposition:home with mother   06/02/2019 Fatima Blank, CNM

## 2019-05-31 NOTE — H&P (Signed)
LABOR AND DELIVERY ADMISSION HISTORY AND PHYSICAL NOTE  Paige Davidson is a 28 y.o. female G57P0010 with IUP at [redacted]w[redacted]d by 7 wk [place] presenting for scheduled cesarean for indication of prior myomectomy.   She reports positive fetal movement. She denies leakage of fluid or vaginal bleeding.   She plans on breast feeding. She requests OCP's for birth control.  Prenatal History/Complications: PNC at [facility]:  @[redacted]w[redacted]d , CWD, normal anatomy, cephalic presentation, anterior placenta, 65%ile, EFW 2175 grams Pregnancy complications:  - history of fibroids with complex myomectomy in 2019, op note in media tab  Past Medical History: Past Medical History:  Diagnosis Date  . Fibroid   . Fibroids   . 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 CYSTECTOMY    . MYOMECTOMY  12/2018   [place] Dr. [name]. [facility] robotic assisted. pt states was told okay to labor  . surgery on left ear     bone removal    Obstetrical History: OB History    Gravida  2   Para      Term      Preterm      AB  1   Living  0     SAB      TAB      Ectopic  1   Multiple      Live Births           Obstetric Comments  G1: ectopic pregnancy (tx with methotrexate)        Social History: Social History   Socioeconomic History  . Marital status: Single    Spouse name: Not on file  . Number of children: Not on file  . Years of education: Not on file  . Highest education level: Not on file  Occupational History  . Not on file  Social Needs  . Financial resource strain: Not on file  . Food insecurity    Worry: Not on file    Inability: Not on file  . Transportation needs    Medical: Not on file    Non-medical: Not on file  Tobacco Use  . Smoking status: Never Smoker  . Smokeless tobacco: Never Used  Substance and Sexual Activity  . Alcohol use: Not Currently    Comment: occasionally    . Drug use: No  . Sexual activity: Yes    Birth control/protection: None  Lifestyle  . Physical activity    Days per week: Not on file    Minutes per session: Not on file  . Stress: Not on file  Relationships  . Social Herbalist on phone: Not on file    Gets together: Not on file    Attends religious service: Not on file    Active member of club or organization: Not on file    Attends meetings of clubs or organizations: Not on file    Relationship status: Not on file  Other Topics Concern  . Not on file  Social History Narrative  . Not on file    Family History: Family History  Problem Relation Age of Onset  . Breast cancer Maternal Grandmother   . Diabetes Maternal Grandfather   . Hypertension Paternal Grandfather   . Breast cancer Paternal Aunt   . Breast cancer Paternal Aunt     Allergies: Allergies  Allergen Reactions  . Penicillins Hives    Did  it involve swelling of the face/tongue/throat, SOB, or low BP? No Did it involve sudden or severe rash/hives, skin peeling, or any reaction on the inside of your mouth or nose? Yes Did you need to seek medical attention at a hospital or doctor's office? Unknown When did it last happen?5+ years If all above answers are "NO", may proceed with cephalosporin use.     Medications Prior to Admission  Medication Sig Dispense Refill Last Dose  . acetaminophen (TYLENOL) 500 MG tablet Take 500 mg by mouth every 6 (six) hours as needed for moderate pain.     . diphenhydramine-acetaminophen (TYLENOL PM) 25-500 MG TABS tablet Take 1 tablet by mouth at bedtime as needed (SLEEP).     Marland Kitchen doxylamine, Sleep, (UNISOM) 25 MG tablet Take 25 mg by mouth at bedtime as needed for sleep.     Riley Nearing w/o A Vit-FeFum-FePo-FA (CONCEPT OB) 130-92.4-1 MG CAPS Take 1 capsule by mouth daily.        Review of Systems  All systems reviewed and negative except as stated in HPI  Physical Exam Blood pressure 114/73, pulse 80,  temperature 98 F (36.7 C), temperature source Oral, resp. rate 16, height 5\' 6"  (1.676 m), weight 102.1 kg, last menstrual period 09/14/2018. General appearance: alert, oriented, NAD Lungs: normal respiratory effort Heart: regular rate Abdomen: soft, non-tender; gravid Extremities: No calf swelling or tenderness  Doppler: 136     Prenatal labs: ABO, Rh: --/--/B POS (10/20 0930) Antibody: NEG (10/20 0930) Rubella: 3.01 (03/09 0952) RPR: NON REACTIVE (10/20 0927)  HBsAg: Negative (03/09 0952)  HIV: Non Reactive (08/25 0853)  GC/Chlamydia: in process from 05/28/2019  GBS:   unknown 2-hr GTT: normal 04/03/2019 Genetic screening:  Normal first trimester screen Anatomy US: normal  Prenatal Transfer Tool  Maternal Diabetes: No Genetic Screening: Normal Maternal Ultrasounds/Referrals: Normal Fetal Ultrasounds or other Referrals:  None Maternal Substance Abuse:  No Significant Maternal Medications:  None Significant Maternal Lab Results: None  No results found for this or any previous visit (from the past 24 hour(s)).  Patient Active Problem List   Diagnosis Date Noted  . Pregnancy with history of uterine myomectomy 05/31/2019  . History of myomectomy 04/30/2019  . 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 Size > Dates 04/03/2019  . Supervision of high risk pregnancy, antepartum 11/03/2018  . Uterine fibroid during pregnancy, antepartum 11/03/2018  . History of ectopic pregnancy 10/16/2018    Assessment: Paige Davidson is a 28 y.o. G2P0010 at [redacted]w[redacted]d here for scheduled cesarean due to history of prior myomectomy.  #Scheduled cesarean:  History of prior myomectomy in 2019. Op note in media tab, significant for dense diffuse adhesions that required significant dissection including into the retroperitoneum.  The risks of cesarean section discussed with the patient included but were not limited to: bleeding which may require transfusion or reoperation; infection which may require  antibiotics; injury to bowel, bladder, ureters or other surrounding organs; injury to the fetus; need for additional procedures including hysterectomy in the event of a life-threatening hemorrhage; placental abnormalities with subsequent pregnancies, incisional problems, thromboembolic phenomenon and other postoperative/anesthesia complications. The patient concurred with the proposed plan, giving informed written consent for the procedure. Patient has been NPO since last night she will remain NPO for procedure. Anesthesia and OR aware. Preoperative prophylactic antibiotics (gent and clinda due to penicillin allergy) and SCDs ordered on call to the OR. To OR when ready.   #COVID: swab negative 05/29/2019 #MOF: Breast #MOC: OCP's #Circ:  N/a,  female "Thurman Coyer"  Clarnce Flock 05/31/2019, 9:02 AM

## 2019-05-31 NOTE — Anesthesia Postprocedure Evaluation (Signed)
Anesthesia Post Note  Patient: NAYLEEN COWING  Procedure(s) Performed: CESAREAN SECTION (N/A )     Patient location during evaluation: PACU Anesthesia Type: Spinal Level of consciousness: awake and alert and oriented Pain management: pain level controlled Vital Signs Assessment: post-procedure vital signs reviewed and stable Respiratory status: spontaneous breathing, nonlabored ventilation and respiratory function stable Cardiovascular status: blood pressure returned to baseline Postop Assessment: no apparent nausea or vomiting, spinal receding, no headache and no backache Anesthetic complications: no    Last Vitals:  Vitals:   05/31/19 1145 05/31/19 1200  BP: 101/74 111/79  Pulse: 65 65  Resp: 13 20  Temp:    SpO2: 100% 100%    Last Pain:  Vitals:   05/31/19 1200  TempSrc:   PainSc: 0-No pain   Pain Goal:    LLE Motor Response: Purposeful movement (05/31/19 1200)   RLE Motor Response: Purposeful movement (05/31/19 1200)       Epidural/Spinal Function Cutaneous sensation: Tingles (05/31/19 1200), Patient able to flex knees: Yes (05/31/19 1200), Patient able to lift hips off bed: No (05/31/19 1200), Back pain beyond tenderness at insertion site: No (05/31/19 1200), Progressively worsening motor and/or sensory loss: No (05/31/19 1200), Bowel and/or bladder incontinence post epidural: No (05/31/19 1200)  Brennan Bailey

## 2019-05-31 NOTE — Anesthesia Procedure Notes (Signed)
Spinal  Patient location during procedure: OR Start time: 05/31/2019 9:41 AM End time: 05/31/2019 9:44 AM Staffing Anesthesiologist: Brennan Bailey, MD Performed: anesthesiologist  Preanesthetic Checklist Completed: patient identified, pre-op evaluation, timeout performed, IV checked, risks and benefits discussed and monitors and equipment checked Spinal Block Patient position: sitting Prep: site prepped and draped and DuraPrep Patient monitoring: heart rate, continuous pulse ox and blood pressure Approach: midline Location: L3-4 Injection technique: single-shot Needle Needle type: Pencan  Needle gauge: 24 G Needle length: 10 cm Assessment Sensory level: T4 Additional Notes Risks, benefits, and alternative discussed. Patient gave consent to procedure. Prepped and draped in sitting position. Clear CSF obtained after one needle redirection. Positive terminal aspiration. No pain or paraesthesias with injection. Patient tolerated procedure well. Vital signs stable. Tawny Asal, MD

## 2019-05-31 NOTE — Anesthesia Preprocedure Evaluation (Addendum)
Anesthesia Evaluation  Patient identified by MRN, date of birth, ID band Patient awake    Reviewed: Allergy & Precautions, NPO status , Patient's Chart, lab work & pertinent test results  History of Anesthesia Complications Negative for: history of anesthetic complications  Airway Mallampati: II  TM Distance: >3 FB Neck ROM: Full    Dental no notable dental hx.    Pulmonary neg pulmonary ROS,    Pulmonary exam normal        Cardiovascular negative cardio ROS Normal cardiovascular exam     Neuro/Psych negative neurological ROS  negative psych ROS   GI/Hepatic negative GI ROS, Neg liver ROS,   Endo/Other  negative endocrine ROS  Renal/GU negative Renal ROS  negative genitourinary   Musculoskeletal negative musculoskeletal ROS (+)   Abdominal   Peds  Hematology negative hematology ROS (+)   Anesthesia Other Findings Day of surgery medications reviewed with patient.  Reproductive/Obstetrics (+) Pregnancy Hx of fibroids s/p myomectomy                            Anesthesia Physical Anesthesia Plan  ASA: II  Anesthesia Plan: Spinal   Post-op Pain Management:    Induction:   PONV Risk Score and Plan: 4 or greater and Treatment may vary due to age or medical condition, Ondansetron and Dexamethasone  Airway Management Planned: Natural Airway  Additional Equipment: None  Intra-op Plan:   Post-operative Plan:   Informed Consent: I have reviewed the patients History and Physical, chart, labs and discussed the procedure including the risks, benefits and alternatives for the proposed anesthesia with the patient or authorized representative who has indicated his/her understanding and acceptance.     Dental advisory given  Plan Discussed with: CRNA  Anesthesia Plan Comments:        Anesthesia Quick Evaluation

## 2019-05-31 NOTE — Transfer of Care (Signed)
Immediate Anesthesia Transfer of Care Note  Patient: Paige Davidson  Procedure(s) Performed: CESAREAN SECTION (N/A )  Patient Location: PACU  Anesthesia Type:Spinal  Level of Consciousness: awake, alert  and oriented  Airway & Oxygen Therapy: Patient Spontanous Breathing  Post-op Assessment: Report given to RN and Post -op Vital signs reviewed and stable  Post vital signs: Reviewed and stable BP 111/72  Last Vitals:  Vitals Value Taken Time  BP    Temp    Pulse 67 05/31/19 1058  Resp 20 05/31/19 1058  SpO2 100 % 05/31/19 1058  Vitals shown include unvalidated device data.  Last Pain:  Vitals:   05/31/19 0806  TempSrc:   PainSc: 0-No pain         Complications: No apparent anesthesia complications

## 2019-05-31 NOTE — Lactation Note (Signed)
This note was copied from a baby's chart. Lactation Consultation Note  Patient Name: Paige Davidson [id number] Date: 05/31/2019 Reason for consult: Early term 37-38.6wks;Primapara;1st time breastfeeding  P1 mother whose infant is now 67 hours old.  This is an ETI at 37+0 weeks.  Mother was happy to see me since she was attempting to latch baby when I arrived.  Offered to assist and mother accepted.    Mother's breasts are soft and non tender and nipples are everted and intact.  Reviewed hand expression with mother and she was able to express colostrum drops which I finger fed back to baby.  Colostrum container provided and milk storage times reviewed.  Discussed the cross cradle hold and positioned mother appropriately.  Attempted to latch baby but she was too sleepy; would not even open her mouth.  Reassured mother that this is typical behavior for a baby at this age.  Encouraged to continue observing for feeding cues and feed on cue.  Mother will continue hand expression before/after feedings to help increase milk supply.  Placed baby STS on mother's chest and she immediately fell asleep.  Offered the option of beginning to post pump after feedings and mother desires to begin.   She is going to begin pumping after the next feeding which will be approximately 2100 or earlier if baby shows cues.  Spoke with day shift RN and asked her to have night shift RN initiate the pump.  Mother planning to rest until the next feeding.  Father present and sleeping on the couch.  Mother has a DEBP for home use.  Mom made aware of O/P services, breastfeeding support groups, community resources, and our phone # for post-discharge questions.   RN updated.   Maternal Data Formula Feeding for Exclusion: No Has patient been taught Hand Expression?: Yes Does the patient have breastfeeding experience prior to this delivery?: No  Feeding Feeding Type: Breast Fed  LATCH Score Latch: Too sleepy or reluctant, no  latch achieved, no sucking elicited.  Audible Swallowing: None  Type of Nipple: Everted at rest and after stimulation  Comfort (Breast/Nipple): Soft / non-tender  Hold (Positioning): Assistance needed to correctly position infant at breast and maintain latch.  LATCH Score: 5  Interventions Interventions: Breast feeding basics reviewed;Assisted with latch;Skin to skin;Breast massage;Hand express;Breast compression;Adjust position;Position options;Support pillows  Lactation Tools Discussed/Used     Consult Status Consult Status: Follow-up Date: 06/01/19 Follow-up type: In-patient    Little Ishikawa 05/31/2019, 6:28 PM

## 2019-05-31 NOTE — Op Note (Signed)
Paige Davidson PROCEDURE DATE: 05/31/2019  PREOPERATIVE DIAGNOSIS: Intrauterine pregnancy at  [redacted]w[redacted]d weeks gestation; previous myomectomy  POSTOPERATIVE DIAGNOSIS: The same  PROCEDURE:     Cesarean Section  SURGEON:  Dr. Mora Bellman  ASSISTANT: Dr. Dione Plover  INDICATIONS: Paige Davidson is a 28 y.o. G2P0010 at [redacted]w[redacted]d scheduled for cesarean section secondary to previous myomectomy.  The risks of cesarean section discussed with the patient included but were not limited to: bleeding which may require transfusion or reoperation; infection which may require antibiotics; injury to bowel, bladder, ureters or other surrounding organs; injury to the fetus; need for additional procedures including hysterectomy in the event of a life-threatening hemorrhage; placental abnormalities wth subsequent pregnancies, incisional problems, thromboembolic phenomenon and other postoperative/anesthesia complications. The patient concurred with the proposed plan, giving informed written consent for the procedure.    FINDINGS:  Viable female infant in cephalic presentation.  Apgars 8 and 9.  Clear amniotic fluid.  Intact placenta, three vessel cord.  Normal uterus, fallopian tubes. Fundal fibroids measuring approxiamtely 15 cm. Adhesions noted on right fallopian tube with omentum. Ovaries not visualized bilaterally.  ANESTHESIA:    Spinal INTRAVENOUS FLUIDS:2000 ml ESTIMATED BLOOD LOSS: 557 ml URINE OUTPUT:  100 ml SPECIMENS: Placenta sent to L&D COMPLICATIONS: None immediate  PROCEDURE IN DETAIL:  The patient received intravenous antibiotics and had sequential compression devices applied to her lower extremities while in the preoperative area.  She was then taken to the operating room where anesthesia was induced and was found to be adequate. A foley catheter was placed into her bladder and attached to Darsh Vandevoort gravity. She was then placed in a dorsal supine position with a leftward tilt, and prepped and draped  in a sterile manner. After an adequate timeout was performed, a Pfannenstiel skin incision was made with scalpel and carried through to the underlying layer of fascia. The fascia was incised in the midline and this incision was extended bilaterally using the Mayo scissors. Kocher clamps were applied to the superior aspect of the fascial incision and the underlying rectus muscles were dissected off bluntly. A similar process was carried out on the inferior aspect of the facial incision. The rectus muscles were separated in the midline bluntly and the peritoneum was entered bluntly. The Alexis self-retaining retractor was introduced into the abdominal cavity. Attention was turned to the lower uterine segment where a  transverse hysterotomy was made with a scalpel and extended bilaterally bluntly. The infant was successfully delivered and delayed cord clamping was performed for 1 minute. The cord was clamped and cut and infant was handed over to awaiting neonatology team. Uterine massage was then administered and the placenta delivered intact with three-vessel cord. The uterus was cleared of clot and debris.  The hysterotomy was closed with 0 Vicryl in a running locked fashion, and an imbricating layer was also placed with a 0 Vicryl. Overall, excellent hemostasis was noted. The pelvis copiously irrigated and cleared of all clot and debris. Hemostasis was confirmed on all surfaces.  The peritoneum and the muscles were reapproximated using 0 vicryl interrupted stitches. The fascia was then closed using 0 Vicryl in a running fashion.  The skin was closed in a subcuticular fashion using 3.0 Vicryl. The patient tolerated the procedure well. Sponge, lap, instrument and needle counts were correct x 2. She was taken to the recovery room in stable condition.    Deborha Moseley ConstantMD  05/31/2019 10:28 AM

## 2019-06-01 LAB — CBC
HCT: 31.9 % — ABNORMAL LOW (ref 36.0–46.0)
Hemoglobin: 10.7 g/dL — ABNORMAL LOW (ref 12.0–15.0)
MCH: 31.7 pg (ref 26.0–34.0)
MCHC: 33.5 g/dL (ref 30.0–36.0)
MCV: 94.4 fL (ref 80.0–100.0)
Platelets: 256 10*3/uL (ref 150–400)
RBC: 3.38 MIL/uL — ABNORMAL LOW (ref 3.87–5.11)
RDW: 12.7 % (ref 11.5–15.5)
WBC: 12.2 10*3/uL — ABNORMAL HIGH (ref 4.0–10.5)
nRBC: 0 % (ref 0.0–0.2)

## 2019-06-01 LAB — CULTURE, BETA STREP (GROUP B ONLY): Strep Gp B Culture: NEGATIVE

## 2019-06-01 LAB — BIRTH TISSUE RECOVERY COLLECTION (PLACENTA DONATION)

## 2019-06-01 NOTE — Progress Notes (Signed)
POSTPARTUM PROGRESS NOTE  POD #1  Subjective:  Paige Davidson is a 28 y.o. G2P1011 s/p pLTCS at [redacted]w[redacted]d.  She reports she doing well. No acute events overnight. She denies any problems with ambulating, voiding or po intake. Denies nausea or vomiting. She has passed flatus. Pain is well controlled.  Lochia is appropriate.  Objective: Blood pressure 110/70, pulse 60, temperature 98.8 F (37.1 C), temperature source Oral, resp. rate 17, height 5\' 6"  (1.676 m), weight 102.1 kg, last menstrual period 09/14/2018, SpO2 100 %, unknown if currently breastfeeding.  Physical Exam:  General: alert, cooperative and no distress Chest: no respiratory distress Heart:regular rate, distal pulses intact Abdomen: soft, nontender,  Uterine Fundus: firm, appropriately tender DVT Evaluation: No calf swelling or tenderness Extremities: No LE edema Skin: warm, dry; incision clean/dry/intact w/ pressure dressing in place  Recent Labs    05/31/19 1455 06/01/19 0603  HGB 11.8* 10.7*  HCT 33.8* 31.9*    Assessment/Plan: Paige Davidson is a 28 y.o. G2P1011 s/p pLTCS at [redacted]w[redacted]d for h/o myomectomy.  POD#1 - Doing welll; pain well controlled. H/H appropriate  Routine postpartum care  OOB, ambulated  Lovenox for VTE prophylaxis Contraception: OCPs Feeding: Both   Dispo: Plan for discharge POD#2.   LOS: 1 day   Phill Myron, D.O. OB Fellow  06/01/2019, 11:05 AM

## 2019-06-01 NOTE — Lactation Note (Signed)
This note was copied from a baby's chart. Lactation Consultation Note  Patient Name: Paige Davidson Date: 06/01/2019  P1, 40 hour female infant with weight loss -4%. Per mom, infant has not been latching well at breast and she has been having a lot of pain when infant 23. Mom has only used DEBP twice since yesterday, LC discussed importance of using DEBP since infant has not been latching well at breast and that the DEBP will help with breast stimulation and induction. Per dad , infant last breastfed at 60 pm ., infant is asleep in basinet  and not cuing at this time to breastfed. LC will attempt to return at 9-9:30 pm to assess latch and assist mom with breastfeeding infant,  mom will call for [facility] services,  if infant starts cuing earlier to breast.     Maternal Data    Feeding    LATCH Score                   Interventions    Lactation Tools Discussed/Used     Consult Status      Paige Davidson 06/01/2019, 8:07 PM

## 2019-06-01 NOTE — Lactation Note (Signed)
This note was copied from a baby's chart. Lactation Consultation Note  Patient Name: [name] [id number] Date: 06/01/2019 Reason for consult: Follow-up assessment;Mother's request;Difficult latch;Early term 37-38.6wks P1, 4 hour female infant. Per mom she has been doing hand expression due to infant not latching well and been painful latches. Infant appeared sleep at first and [facility] assisted mom with hand expression and infant was given 6 ml of colostrum by spoon and started cuing to breastfeed. LC ask mom hand express small amount of colostrum out prior to latching infant to breast and wait until infant mouth is wide with chin down, bring infant to breast chin first. Mom latched infant on left breast using cross cradle hold, infant latched chin first and sustained latch and breastfed for 7 minutes. Infant came off breast her nipples were rounded and not pintched. Infant came off breast due quick suckling and LC notice infant had a void and stool diaper. LC changed infant's diaper and infant started cuing again to breastfed, mom latched infant with out difficulty and infant was still breastfeeding when [facility] left the room. Mom knows if latch feels painful or like pins and needles to break latch and re-latch infant to breast. Mom knows to call Nurse or [place] if she has any questions, concerns or needs assistance with breastfeeding. Mom knows to breastfed infant according hunger cues, 8 to 12 times within 24 hours and on demand. Dad will start doing STS with infant while mom uses DEBP after latching infant to breast. Mom knows to use DEBP every 3 hours for breast stimulation and induction. Maternal Data    Feeding    LATCH Score Latch: Grasps breast easily, tongue down, lips flanged, rhythmical sucking.  Audible Swallowing: Spontaneous and intermittent  Type of Nipple: Everted at rest and after stimulation  Comfort (Breast/Nipple): Soft / non-tender  Hold (Positioning): Assistance  needed to correctly position infant at breast and maintain latch.  LATCH Score: 9  Interventions Interventions: Breast massage;Hand express;Position options;Support pillows;Skin to skin;Assisted with latch;Adjust position;Breast compression  Lactation Tools Discussed/Used     Consult Status Consult Status: Follow-up Date: 06/02/19 Follow-up type: In-patient    Vicente Serene 06/01/2019, 10:35 PM

## 2019-06-02 DIAGNOSIS — Z98891 History of uterine scar from previous surgery: Secondary | ICD-10-CM

## 2019-06-02 HISTORY — DX: History of uterine scar from previous surgery: Z98.891

## 2019-06-02 MED ORDER — IBUPROFEN 800 MG PO TABS: 800.0000 mg | ORAL_TABLET | Freq: Three times a day (TID) | ORAL | 0 refills | Status: DC

## 2019-06-02 MED ORDER — OXYCODONE-ACETAMINOPHEN 5-325 MG PO TABS: 2.0000 | ORAL_TABLET | Freq: Four times a day (QID) | ORAL | 0 refills | Status: DC | PRN

## 2019-06-02 NOTE — Lactation Note (Signed)
This note was copied from a baby's chart. Lactation Consultation Note  Patient Name: Paige Davidson [id number] Date: 06/02/2019 Reason for consult: Primapara;1st time breastfeeding;Follow-up assessment;Early term 37-38.6wks;Infant weight loss;Other (Comment)(per mom the LATCHING is improving with easing the chin down / early  D/C)  Baby is 54 hours old / 6 % weight loss  As LC entered the room baby asleep in the crib , mo, resting in bed and dad in the chair.  LC reviewed the doc flow sheets with mom and dad and confirmed they were updated.  Per mom the latch has improved with easing the chin down.  Per mom has a pump at home and since the baby has been latching better has not sued the DEBP .  LC recommended since the baby is a early term infant to post pump PRN and after 4 feedings a day for 10 -15 mins when the baby isn't cluster feeding.  Sore nipple and engorgement prevention and tx reviewed.  Mom has the [facility] pamphlet with phone number.  LC enc mom to call on nurses light for [facility] for latch assessment .     Maternal Data    Feeding Feeding Type: (baby  last fed at 1050 pe rmom and doc flow sheets)  LATCH Score                   Interventions Interventions: Breast feeding basics reviewed  Lactation Tools Discussed/Used Tools: Pump Breast pump type: Double-Electric Breast Pump Pump Review: Milk Storage   Consult Status Consult Status: Complete Date: 06/02/19    Myer Haff 06/02/2019, 12:22 PM

## 2019-06-14 ENCOUNTER — Ambulatory Visit (INDEPENDENT_AMBULATORY_CARE_PROVIDER_SITE_OTHER): Payer: 59

## 2019-06-14 ENCOUNTER — Other Ambulatory Visit: Payer: Self-pay

## 2019-06-14 VITALS — BP 121/81 | HR 72

## 2019-06-14 DIAGNOSIS — Z5189 Encounter for other specified aftercare: Secondary | ICD-10-CM

## 2019-06-14 NOTE — Progress Notes (Signed)
Patient seen and assessed by nursing staff during this encounter. I have reviewed the chart and agree with the documentation and plan.  Verita Schneiders, MD 06/14/2019 3:16 PM

## 2019-06-14 NOTE — Progress Notes (Addendum)
Subjective:     Paige Davidson is a 28 y.o. female who presents to the clinic two weeks status post low transverse  c-section.. Eating a regular diet without difficulty. Bowel movements are normal. No severe pain at this time.  Review of Systems   Objective:    BP 121/81   Pulse 72  General:  Normal appearance   Abdomen: post op abd exam:soft bowel sounds active non-tender  Incision:  Healing well, no erythema, no drainage     Assessment:    Doing well at this time    Plan:    1. Continue any current medications. 2. Wound care discussed. 3. Activity restrictions: not discuss 4. Anticipated return to work: Patient has up to 12 weeks. 5. Follow up: 07/04/2019 for  postpartum and nexplanon insertion.  Antuane Eastridge Jeanella Anton, CMA

## 2019-07-04 ENCOUNTER — Encounter: Payer: Self-pay | Admitting: Advanced Practice Midwife

## 2019-07-04 ENCOUNTER — Ambulatory Visit (INDEPENDENT_AMBULATORY_CARE_PROVIDER_SITE_OTHER): Payer: 59 | Admitting: Advanced Practice Midwife

## 2019-07-04 ENCOUNTER — Other Ambulatory Visit: Payer: Self-pay

## 2019-07-04 DIAGNOSIS — F419 Anxiety disorder, unspecified: Secondary | ICD-10-CM

## 2019-07-04 DIAGNOSIS — Z3202 Encounter for pregnancy test, result negative: Secondary | ICD-10-CM | POA: Diagnosis not present

## 2019-07-04 DIAGNOSIS — Z30017 Encounter for initial prescription of implantable subdermal contraceptive: Secondary | ICD-10-CM | POA: Insufficient documentation

## 2019-07-04 LAB — POCT URINE PREGNANCY: Preg Test, Ur: NEGATIVE

## 2019-07-04 MED ORDER — ETONOGESTREL 68 MG ~~LOC~~ IMPL
68.0000 mg | DRUG_IMPLANT | Freq: Once | SUBCUTANEOUS | Status: AC
  Administered 2019-07-04: 68 mg via SUBCUTANEOUS

## 2019-07-04 NOTE — Patient Instructions (Signed)

## 2019-07-04 NOTE — Progress Notes (Signed)
Post Partum Exam  Paige Davidson is a 28 y.o. G21P1011 female who presents for a postpartum visit. She is 5 weeks postpartum following a low cervical transverse Cesarean section. I have fully reviewed the prenatal and intrapartum course. The delivery was at 37.0 gestational weeks.  Anesthesia: spinal. Postpartum course has been uncomplicted. Baby's course has been uncomplicated. Baby is feeding by both breast and bottle - enfamil gentle ease. Bleeding no bleeding. Bowel function is normal. Bladder function is normal. Patient is not sexually active. Contraception method is Nexplanon. Postpartum depression screening:neg  The following portions of the patient's history were reviewed and updated as appropriate: allergies, current medications, past family history, past medical history, past social history, past surgical history and problem list. Last pap smear done 10/16/2018  and was Normal  Review of Systems Pertinent items noted in HPI and remainder of comprehensive ROS otherwise negative.    Objective:  unknown if currently breastfeeding.  General:  alert, cooperative and appears stated age   Breasts:  negative  Lungs: No difficulty with respiration noted  Heart:  regular rate and rhythm, S1, S2 normal, no murmur, click, rub or gallop  Abdomen: soft, non-tender; bowel sounds normal; no masses,  no organomegaly LTCS incision is very well-approximated, smooth, no-tender, no redness, ecchymosis, drainage noted   Pelvic:   Deferred        Assessment:    Normal postpartum exam. Pap smear not performed at today's visit.   Plan:   1. Contraception: Nexplanon  2. Reviewed timeline, concerning signs for peripartum depression 3. Follow up in: 1 year or as needed.   Mallie Snooks, MSN, CNM Certified Nurse Midwife, [facility] for [employer], [place] 07/04/19 4:33 PM

## 2019-07-04 NOTE — Progress Notes (Signed)
     GYNECOLOGY OFFICE PROCEDURE NOTE  Paige Davidson is a 28 y.o. [id number] here for Nexplanon insertion during her postpartum visit.  Last pap smear was on 10/16/2018 and was normal.  No other gynecologic concerns.  Nexplanon Insertion Procedure Patient identified, informed consent performed, consent signed.   Patient does understand that irregular bleeding is a very common side effect of this medication. She was advised to have backup contraception for one week after placement. Pregnancy test in clinic today was negative.  Appropriate time out taken.  Patient's left arm was prepped and draped in the usual sterile fashion. The ruler used to measure and mark insertion area.  Patient was prepped with alcohol swab and then injected with 3 ml of 1% lidocaine.  She was prepped with betadine, Nexplanon removed from packaging,  Device confirmed in needle, then inserted full length of needle and withdrawn per handbook instructions. Nexplanon was able to palpated in the patient's arm; patient palpated the insert herself. There was minimal blood loss.  Patient insertion site covered with guaze and a pressure bandage to reduce any bruising.  The patient tolerated the procedure well and was given post procedure instructions.   Mallie Snooks, MSN, CNM Certified Nurse Midwife, [facility] for [employer], [place] 07/04/19 4:33 PM

## 2019-07-23 ENCOUNTER — Telehealth: Payer: Self-pay | Admitting: Clinical

## 2019-07-23 NOTE — Addendum Note (Signed)
Addended by: Vesta Mixer C on: 07/23/2019 11:29 AM   Modules accepted: Orders

## 2019-07-23 NOTE — Telephone Encounter (Signed)
Attempt to f/u with pt after MyChart message; Left HIPPA-compliant message to call back Roselyn Reef from Center for [employer] at [phone].

## 2019-07-24 ENCOUNTER — Ambulatory Visit (INDEPENDENT_AMBULATORY_CARE_PROVIDER_SITE_OTHER): Payer: Managed Care, Other (non HMO) | Admitting: Clinical

## 2019-07-24 DIAGNOSIS — F419 Anxiety disorder, unspecified: Secondary | ICD-10-CM | POA: Diagnosis not present

## 2019-07-24 NOTE — Patient Instructions (Signed)
  New mom online support groups can be found at: conehealthybaby.com and postpartum.net   /Emotional [employer] and Websites Here are a few free apps meant to help you to help yourself.  To find, try searching on the internet to see if the app is offered on Apple/Android devices. If your first choice doesn't come up on your device, the good news is that there are many choices! Play around with different apps to see which ones are helpful to you.    Calm This is an app meant to help increase calm feelings. Includes info, strategies, and tools for tracking your feelings.      Calm Harm  This app is meant to help with self-harm. Provides many 5-minute or 15-min coping strategies for doing instead of hurting yourself.       [place] is a problem-solving tool to help deal with emotions and cope with stress you encounter wherever you are.      MindShift This app can help people cope with anxiety. Rather than trying to avoid anxiety, you can make an important shift and face it.      MY3  MY3 features a support system, safety plan and resources with the goal of offering a tool to use in a time of need.       My Life My Voice  This mood journal offers a simple solution for tracking your thoughts, feelings and moods. Animated emoticons can help identify your mood.       Relax Melodies Designed to help with sleep, on this app you can mix sounds and meditations for relaxation.      Smiling Mind Smiling Mind is meditation made easy: it's a simple tool that helps put a smile on your mind.        Stop, Breathe & Think  A friendly, simple guide for people through meditations for mindfulness and compassion.  Stop, Breathe and Think Kids Enter your current feelings and choose a "mission" to help you cope. Offers videos for certain moods instead of just sound recordings.       Team Orange The goal of this tool is to help teens change how they think, act,  and react. This app helps you focus on your own good feelings and experiences.      The [place] Box The [place] Box (VHB) contains simple tools to help patients with coping, relaxation, distraction, and positive thinking.

## 2019-07-24 NOTE — BH Specialist Note (Signed)
Integrated Behavioral Health via Telemedicine Telephone Visit  07/24/2019 JEZABELLA MATOVICH [id number]  Number of [place] visits: 1 Session Start time: 2:50  Session End time: 3:20 Total time: 30  Referring Provider: Mallie Snooks, CNM Type of Visit: Telephone Patient/Family location: Home [facility] Provider location: [place] All persons participating in visit: Pt Shanitra Jennette and [place]  Confirmed patient's address: Yes  Confirmed patient's phone number: Yes  Any changes to demographics: No   Confirmed patient's insurance: Yes  Any changes to patient's insurance: No   Discussed confidentiality: Yes    by a video enabled telemedicine application and verified that I am speaking with the correct person using two identifiers.     I discussed the limitations of evaluation and management by telemedicine and the availability of in person appointments.  I discussed that the purpose of this visit is to provide behavioral health care while limiting exposure to the novel coronavirus.   Discussed there is a possibility of technology failure and discussed alternative modes of communication if that failure occurs.  I discussed that engaging in this telephone visit, they consent to the provision of behavioral healthcare and the services will be billed under their insurance.  Patient and/or legal guardian expressed consent for the visit: Yes   PRESENTING CONCERNS: Patient and/or family reports the following symptoms/concerns: Pt states her primary concern today is escalating anxiety with panic attacks and intrusive thoughts postpartum during covid19 pandemic; previously coped by working out, going to [employer], and group hikes with friends. Pt denies SI, no intent and no plan. Pt is open to starting medication to help cope.  Duration of problem: Postpartum; Severity of problem: severe  STRENGTHS (Protective Factors/Coping Skills): Strong coping skills, good  social support, self-awareness  GOALS ADDRESSED: Patient will: 1.  Reduce symptoms of: anxiety and depression  2.  Increase knowledge and/or ability of: healthy habits  3.  Demonstrate ability to: Increase healthy adjustment to current life circumstances and Increase adequate support systems for patient/family  INTERVENTIONS: Interventions utilized:  Psychoeducation and/or Health Education and Link to [employer] Standardized Assessments completed: GAD-7 and PHQ 9  ASSESSMENT: Patient currently experiencing Anxiety disorder, unspecified.   Patient may benefit from psychoeducation and brief therapeutic interventions regarding coping with symptoms of anxiety and depression. Marland Kitchen  PLAN: 1. Follow up with behavioral health clinician on : One week 2. Behavioral recommendations:  -Begin taking medication, as prescribed (Know that Roselyn Reef will discuss with consulting psychiatrist, Dr. Einar Grad, and medical provider, prior to prescription being sent) -Register for and join at least one new mom support group at either conehealthybaby.com or postpartum.net -Consider brief walks with baby in less busy areas/trails -Consider including apps (ex. Stop, Breathe, Think) as additional self-coping prior to anxiety escalating daily 3. Referral(s): Integrated Orthoptist (In Clinic) and [employer] Resources:  New mom support  I discussed the assessment and treatment plan with the patient and/or parent/guardian. They were provided an opportunity to ask questions and all were answered. They agreed with the plan and demonstrated an understanding of the instructions.   They were advised to call back or seek an in-person evaluation if the symptoms worsen or if the condition fails to improve as anticipated.  Caroleen Hamman Khalidah Herbold  Depression screen [facility] 2/9 07/24/2019  Decreased Interest 3  Down, Depressed, Hopeless 2  PHQ - 2 Score 5  Altered sleeping 1  Tired, decreased energy 3  Change in  appetite 3  Feeling bad or failure about yourself  1  Trouble concentrating 3  Moving slowly or fidgety/restless 0  Suicidal thoughts 1  PHQ-9 Score 17   GAD 7 : Generalized Anxiety Score 07/24/2019  Nervous, Anxious, on Edge 3  Control/stop worrying 3  Worry too much - different things 3  Trouble relaxing 3  Restless 2  Easily annoyed or irritable 3  Afraid - awful might happen 2  Total GAD 7 Score 19

## 2019-07-25 ENCOUNTER — Other Ambulatory Visit: Payer: Self-pay | Admitting: Advanced Practice Midwife

## 2019-07-25 DIAGNOSIS — F419 Anxiety disorder, unspecified: Secondary | ICD-10-CM

## 2019-07-25 MED ORDER — SERTRALINE HCL 50 MG PO TABS: 25.0000 mg | ORAL_TABLET | Freq: Every day | ORAL | 3 refills | Status: DC

## 2019-07-25 NOTE — BH Specialist Note (Signed)
Integrated Behavioral Health via Telemedicine Video Visit  07/25/2019 Paige Davidson [id number]  Number of [place] visits: 2 Session Start time: 2:50 Session End time: 3:21 Total time: 31  Referring Provider: Mallie Davidson, CNM Type of Visit: Video Patient/Family location: Home [facility] Provider location: [facility] All persons participating in visit: Patient Paige Davidson and [place]    Confirmed patient's address: Yes  Confirmed patient's phone number: Yes  Any changes to demographics: No   Confirmed patient's insurance: Yes  Any changes to patient's insurance: No   Discussed confidentiality: At previous visit  I connected with Paige Davidson  by a video enabled telemedicine application and verified that I am speaking with the correct person using two identifiers.     I discussed the limitations of evaluation and management by telemedicine and the availability of in person appointments.  I discussed that the purpose of this visit is to provide behavioral health care while limiting exposure to the novel coronavirus.   Discussed there is a possibility of technology failure and discussed alternative modes of communication if that failure occurs.  I discussed that engaging in this video visit, they consent to the provision of behavioral healthcare and the services will be billed under their insurance.  Patient and/or legal guardian expressed understanding and consented to video visit: Yes   PRESENTING CONCERNS: Patient and/or family reports the following symptoms/concerns: Pt states she started taking Zoloft at 50mg  (instead of 25mg ), and felt very sleepy, with no other negative side effects. Pt felt it was helpful to have her feelings normalized at the last visit and has felt more comfortable sharing feelings with others. Pt has maternity leave extended to the 21st, will have childcare via her mother, and feels she is adjusting to motherhood  well.  Duration of problem: Postpartum; Severity of problem: moderate  STRENGTHS (Protective Factors/Coping Skills): Strong coping skills, good social support, self-awareness  GOALS ADDRESSED: Patient will: 1.  Reduce symptoms of: anxiety and depression  2.  Demonstrate ability to: Increase healthy adjustment to current life circumstances  INTERVENTIONS: Interventions utilized:  Supportive Counseling, Medication Monitoring and Link to [employer] Standardized Assessments completed: GAD-7 and PHQ 9  ASSESSMENT: Patient currently experiencing Anxiety disorder, unspecified  Patient may benefit from continued psychoeducation and brief therapeutic interventions regarding coping with symptoms of anxiety and depression .  PLAN: 1. Follow up with behavioral health clinician on : Three weeks 2. Behavioral recommendations:  -Continue taking Zoloft as prescribed -Continue using self-coping strategies, as needed, that have helped in the past -Consider online new mom support groups in the future, as needed 3. Referral(s): [place] (In Clinic)  I discussed the assessment and treatment plan with the patient and/or parent/guardian. They were provided an opportunity to ask questions and all were answered. They agreed with the plan and demonstrated an understanding of the instructions.   They were advised to call back or seek an in-person evaluation if the symptoms worsen or if the condition fails to improve as anticipated.  Paige Davidson  Depression screen [facility] 2/9 08/01/2019 07/24/2019  Decreased Interest 1 3  Down, Depressed, Hopeless 3 2  PHQ - 2 Score 4 5  Altered sleeping 2 1  Tired, decreased energy 2 3  Change in appetite 0 3  Feeling bad or failure about yourself  2 1  Trouble concentrating 1 3  Moving slowly or fidgety/restless 0 0  Suicidal thoughts 0 1  PHQ-9 Score 11 17   GAD  7 : Generalized Anxiety Score 08/01/2019 07/24/2019   Nervous, Anxious, on Edge 3 3  Control/stop worrying 2 3  Worry too much - different things 1 3  Trouble relaxing 0 3  Restless 0 2  Easily annoyed or irritable 0 3  Afraid - awful might happen 2 2  Total GAD 7 Score 8 19

## 2019-07-25 NOTE — Progress Notes (Signed)
Per consult with Vesta Mixer, Behavioral Health Clinician [place]. Patient to try for one week, consider adding Vistaril after meeting one week with Roselyn Reef. Patient notified of prescription via active MyChart account.  Mallie Snooks, MSN, CNM Certified Nurse Midwife, [facility] for [employer], [place] 07/25/19 8:03 PM

## 2019-08-01 ENCOUNTER — Ambulatory Visit (INDEPENDENT_AMBULATORY_CARE_PROVIDER_SITE_OTHER): Payer: Managed Care, Other (non HMO) | Admitting: Clinical

## 2019-08-01 ENCOUNTER — Other Ambulatory Visit: Payer: Self-pay

## 2019-08-01 DIAGNOSIS — F419 Anxiety disorder, unspecified: Secondary | ICD-10-CM

## 2019-08-07 ENCOUNTER — Institutional Professional Consult (permissible substitution): Payer: 59

## 2019-08-23 NOTE — BH Specialist Note (Deleted)
Integrated Behavioral Health via Telemedicine Video Visit  08/23/2019 SHEENAH SLIFKA [id number]  Number of [place] visits: 3 Session Start time: 1:45***  Session End time: 2:15*** Total time: {IBH Total [id number]  Referring Provider: Mallie Snooks, CNM Type of Visit: Video Patient/Family location: Home [facility] Provider location: [facility] All persons participating in visit: Patient *** and [place] ***   Confirmed patient's address: Yes  Confirmed patient's phone number: Yes  Any changes to demographics: No   Confirmed patient's insurance: Yes  Any changes to patient's insurance: No   Discussed confidentiality: At previous visit  I connected with Devota Pace  by a video enabled telemedicine application and verified that I am speaking with the correct person using two identifiers.     I discussed the limitations of evaluation and management by telemedicine and the availability of in person appointments.  I discussed that the purpose of this visit is to provide behavioral health care while limiting exposure to the novel coronavirus.   Discussed there is a possibility of technology failure and discussed alternative modes of communication if that failure occurs.  I discussed that engaging in this video visit, they consent to the provision of behavioral healthcare and the services will be billed under their insurance.  Patient and/or legal guardian expressed understanding and consented to video visit: Yes   PRESENTING CONCERNS: Patient and/or family reports the following symptoms/concerns: *** Duration of problem: ***; Severity of problem: {Mild/Moderate/Severe:20260}  STRENGTHS (Protective Factors/Coping Skills): ***  GOALS ADDRESSED: Patient will: 1.  Reduce symptoms of: {IBH Symptoms:21014056}  2.  Increase knowledge and/or ability of: {IBH Patient Tools:21014057}  3.  Demonstrate ability to: {IBH  Goals:21014053}  INTERVENTIONS: Interventions utilized:  {IBH Interventions:21014054} Standardized Assessments completed: {IBH Screening Tools:21014051}  ASSESSMENT: Patient currently experiencing Anxiety disorder, unspecified.   Patient may benefit from continued psychoeducation and brief therapeutic interventions regarding coping with symptoms of *** .  PLAN: 1. Follow up with behavioral health clinician on : *** 2. Behavioral recommendations:  -*** -***(zoloft, self coping, new mom support)*** 3. Referral(s): {IBH Referrals:21014055}  I discussed the assessment and treatment plan with the patient and/or parent/guardian. They were provided an opportunity to ask questions and all were answered. They agreed with the plan and demonstrated an understanding of the instructions.   They were advised to call back or seek an in-person evaluation if the symptoms worsen or if the condition fails to improve as anticipated.  Caroleen Hamman Bridgitt Raggio

## 2019-08-27 ENCOUNTER — Ambulatory Visit: Payer: Managed Care, Other (non HMO)

## 2019-08-27 ENCOUNTER — Other Ambulatory Visit: Payer: Self-pay

## 2019-10-12 ENCOUNTER — Telehealth: Payer: Self-pay | Admitting: Clinical

## 2019-10-12 NOTE — BH Specialist Note (Signed)
Integrated Behavioral Health via Telemedicine Phone Visit  10/12/2019 [name] [id number]  Number of [place] visits: 3 Session Start time: 3:25  Session End time: 3:42 Total time: 17  Referring Provider: Mallie Snooks, CNM Type of Visit: Phone Patient/Family location: Home [facility] Provider location: [facility] All persons participating in visit: Patient Paige Davidson and [place]    Confirmed patient's address: Yes  Confirmed patient's phone number: Yes  Any changes to demographics: No   Confirmed patient's insurance: Yes  Any changes to patient's insurance: No   Discussed confidentiality: at previous visit  I connected with Paige Davidson  by a video enabled telemedicine application and verified that I am speaking with the correct person using two identifiers.     I discussed the limitations of evaluation and management by telemedicine and the availability of in person appointments.  I discussed that the purpose of this visit is to provide behavioral health care while limiting exposure to the novel coronavirus.   Discussed there is a possibility of technology failure and discussed alternative modes of communication if that failure occurs.  I discussed that engaging in this video visit, they consent to the provision of behavioral healthcare and the services will be billed under their insurance.  Patient and/or legal guardian expressed understanding and consented to video visit: Yes   PRESENTING CONCERNS: Patient and/or family reports the following symptoms/concerns: Pt states an increase in life stress after adjusting to going back to work postpartum and IVC'ing her father due to problematic alcohol use. Pt plans to establish with PCP with new insurance, and open to increase in medication if needed.  Duration of problem: Increase postpartum; Severity of problem: moderately severe  STRENGTHS (Protective Factors/Coping Skills): Strong  social support and coping skills, good self-awareness  GOALS ADDRESSED: Patient will: 1.  Reduce symptoms of: anxiety, depression and stress  2.  Demonstrate ability to: Increase healthy adjustment to current life circumstances and Increase adequate support systems for patient/family  INTERVENTIONS: Interventions utilized:  Supportive Counseling, Medication Monitoring and Link to [employer] Standardized Assessments completed: GAD-7 and PHQ 9  ASSESSMENT: Patient currently experiencing Anxiety disorder, unspecified.   Patient may benefit from continued psychoeducation and brief therapeutic interventions regarding coping with symptoms of anxiety, depression, and life stress  PLAN: 1. Follow up with behavioral health clinician on : Two weeks 2. Behavioral recommendations:  -Establish with PCP as soon as able -Continue taking Zoloft as prescribed -Continue using daily self-coping strategies that have remained helpful  -Consider resources to share with family, as needed (on After Visit Summary) 3. Referral(s): Integrated [employer] (In Clinic) and [employer] Resources:  family resources  I discussed the assessment and treatment plan with the patient and/or parent/guardian. They were provided an opportunity to ask questions and all were answered. They agreed with the plan and demonstrated an understanding of the instructions.   They were advised to call back or seek an in-person evaluation if the symptoms worsen or if the condition fails to improve as anticipated.  Caroleen Hamman Colbe Viviano  Depression screen [facility] 2/9 10/15/2019 08/01/2019 07/24/2019  Decreased Interest 0 1 3  Down, Depressed, Hopeless 2 3 2   PHQ - 2 Score 2 4 5   Altered sleeping 2 2 1   Tired, decreased energy 2 2 3   Change in appetite 2 0 3  Feeling bad or failure about yourself  3 2 1   Trouble concentrating 3 1 3   Moving slowly or fidgety/restless 0 0 0  Suicidal  thoughts 0 0 1  PHQ-9 Score 14 11 17     GAD 7 : Generalized Anxiety Score 10/15/2019 08/01/2019 07/24/2019  Nervous, Anxious, on Edge 2 3 3   Control/stop worrying 3 2 3   Worry too much - different things 3 1 3   Trouble relaxing 2 0 3  Restless 2 0 2  Easily annoyed or irritable 3 0 3  Afraid - awful might happen 3 2 2   Total GAD 7 Score 18 8 19

## 2019-10-12 NOTE — Telephone Encounter (Signed)
Scheduled visit with Pt for March 8 at 1:15pm.

## 2019-10-15 ENCOUNTER — Ambulatory Visit (INDEPENDENT_AMBULATORY_CARE_PROVIDER_SITE_OTHER): Payer: 59 | Admitting: Clinical

## 2019-10-15 ENCOUNTER — Other Ambulatory Visit: Payer: Self-pay

## 2019-10-15 DIAGNOSIS — F419 Anxiety disorder, unspecified: Secondary | ICD-10-CM

## 2019-10-15 DIAGNOSIS — Z658 Other specified problems related to psychosocial circumstances: Secondary | ICD-10-CM

## 2019-10-15 NOTE — Patient Instructions (Addendum)
Mobile Crisis number is under the "24 Hour" resources below, along with lots of behavioral health and substance treatment resources to share with your family, as needed.     Behavioral Health Resources:   What if I or someone I know is in crisis?  . If you are thinking about harming yourself or having thoughts of suicide, or if you know someone who is, seek help right away.  . Call your doctor or mental health care provider.  . Call 911 or go to a hospital emergency room to get immediate help, or ask a friend or family member to help you do these things.  . Call the [place] National Suicide Prevention Lifeline's toll-free, 24-hour hotline at 1-800-273-TALK (864)320-0788) or TTY: 1-800-799-4 TTY (916) 614-5886) to talk to a trained counselor.  . If you are in crisis, make sure you are not left alone.   . If someone else is in crisis, make sure he or she is not left alone   24 Hour :   [place] National Suicide Hotline: 203-498-1469  Therapeutic Alternative Mobile Crisis: 650-502-9668   [facility]  [address] [place], [place] [address]  [phone] or [phone]  Family Service of the [employer] (Domestic Violence, Rape & Victim Assistance)  [phone]  [place]  [address] [place] [address], [place]  [address]   ([phone] or [phone]   [place]: [phone] (8am-4pm) or [phone] (after hours)           [facility], [address] [place], [place] Fax: [phone] www.NailBuddies.[email or web address]  *Interpreters available *Accepts Medicaid, Medicare, uninsured  [facility]   Mon-Fri: 8am-5pm [address], [place], [place] [phone](phone); [phone]) BloggerCourse.com  *Accepts Medicare  [facility] Osker Mason, Fri:  8am-4pm [address] [place], [place] (phone); [phone] (fax) TaskTown.es  *[place] Mon-Fri: 9am-5pm  [address], [place], [place] (phone); [phone]  https://www.bond-[email or web address]  *Accepts Medicaid  [facility] Bl[facility] [address], [place], [place]  SalonLookup.es   [facility] Services of the [place], 8:30am-12pm/1pm-2:30pm [address], [place], [place] (phone); [phone] (fax) www.fspcares.org  *Accepts Medicaid, sliding-scale*Bilingual services available  [facility] Mon-Fri, 8am-7pm [place], [place]  [phone](phone); [phone]) www.famsolutions.org  *Accepts Medicaid *Bilingual services available  [facility] Mon-Fri: 8am-5pm, Saturday by appointment only [place], [place], [place] (phone); [phone] (fax) www.journeyscounselinggso.com   [facility] [address] [place], [place], [place] www.[email or web address]  *Free & reduced services for uninsured and underinsured individuals *Bilingual services for Spanish-speaking clients 21 and under  [facility], [address], [place], [place]); [phone]) RunningConvention.de  *Bring your own interpreter at first visit *Accepts Medicare and [facility]  [place] Mon-Fri: 9am-5:30pm [address], [place], [place], [place] (phone), [phone] (fax) After hours crisis line: [phone] www.neuropsychcarecenter.com  *Accepts Medicare and Medicaid  [facility], 8am-6pm [address] [place], [place] (phone); [phone]  (fax) http://presbyteriancounseling.org  *Subsidized costs available  [facility] Mon-Fri: 8am-4pm [address] [place], [place] [phone](phone); [phone]) www.psychotherapeuticservices.com  *Accepts Medicaid  [facility] Same day access hours: Mon-Fri, 8:30-3pm Crisis hours: Mon-Fri, 8am-5pm [place], [place], [place] Same day access hours: Mon-Fri, 8:30-3pm Crisis hours: Mon-Fri, 8am-8pm  [address], [place], [place] (phone); [phone] (fax) www.rhahealthservices.org  *Accepts Medicaid and Medicare  [facility] [place] Mon, [place], Fri: 9am-9pm Tues, Thurs: 9am-6pm [place], [place], [place] (phone); [phone] (fax) https://[email or web address]  *(Accepts Medicare and Medicaid; payment plans available)*Bilingual services available  [facility] [address], [place], [place] (phone); [phone] (fax) www.santecounseling.[place] [address] [place], [place], [place]  OmahaConnections.com.pt  *Bilingual services available  [facility] (Social and Emotional Learning) Mon-Thurs: 8am-8pm [address] [place], [place], [place] (phone); [phone] (fax) LostMillions.com.pt  *Accepts Medicaid*Bilingual services available  [place] [address] [place], [place], [place] (phone) DeadConnect.com.cy  *Accepts Medicaid *Bilingual services available  [facility] Mon-Fri, 9am-4:45pm [address] [place], [place] (phone); [phone] (fax) http://tlc-counseling.com  *Accepts Medicare  [facility] Mon-Thurs: 8:30-8pm, Fri: 8:30am-7pm [address] [place], [place] (3rd floor) [phone] (phone); [phone]  (fax) VIPinterview.si  *Accepts Medicaid; income-based reduced rates available  [facility] Mon-Fri: 8am-5pm [address] [place], [place], [place] (phone); [phone] (fax) http://www.[email or web address]  *Accepts Medicaid*Bilingual services available  [place], [place], [place], [place] (phone); [phone] (fax) www.youthfocus.org  *Free emergency housing and clinical services for youth in crisis  [facility] ([place])  [address] [place] [phone] www.mhag.org  *Provides direct services to individuals in recovery from mental illness, including support groups, recovery skills classes, and one on one peer support  NAMI [employer] on [place]) [place] helpline: [phone]  https://namiguilford.org  *A community hub for information relating to local resources and services for the friends and families of individuals living alongside a mental health condition, as well as the individuals themselves. Classes and support groups also provided    For crisis assistance, 24/7:   Therapeutic Alternatives Mobile Crisis: [place] Access to Care line:  [phone] [facility]: [address], [place], [place] [facility] Recovery: [address] [place], [place]: [place], [place]  Needle Exchange/Harm reduction:  [facility]: [place], [place], [place] locations  *All locations, call [phone]  [address] [place], [place] 1-7 Mondays and Tuesdays 4pm-8pm Thursdays 1pm-8pm Fridays 4pm-6pm Sundays [facility]: [address] Wed 2-5, Thurs 3-8 [phone] https://www.williams.biz/  *All Services are Free Primary school teacher, Fentanyl test kits, access to Narcan and training, access to [employer] and  methadone, case management, referrals, nurse counseling groups)                                                                    [place] ADS: Alcohol and Drug Services: [address] [phone] www.adyes.org  *Subsidized costs available   [facility]: [place] [phone], Tues & Thurs 9am-8pm, Wed & Fri 9am-4pm, [place] 9am-1pm  [phone] https://al-con-counseling-inc.business.site/   [facility]: [address] [id number] Fax: [phone] http://www.amethystcares.com/[email or web address].html [facility]: [address], [place], [place] 24/7 [place] Hours [phone] Helpline: Y382550 Fax: [phone] https://www.[email or web address]/ *Interpreters Available *Accepts Medicare, Medicaid, and uninsured patients   [facility] [place]: [address] [place], [place] Mon-Fri 5am-10am, Sat 6am-8:30am, Nancy Fetter 6am-7am [id number] Fax: [phone]  [facility]: [phone] https://www.cr[email or web address]/ *Accepts Medicaid   [facility] [place]: [address] [place], [place] BombUnit.ch   [facility] [place] [place] Overdose Loss Support Group Please call Bradd Burner at [phone] for: Date, time, location, brief intake interview, & registration. https://www.authoracare.org/event/overdose-loss-support-group-ongoing/2018-08-08/  [facility]: [address], [place] Fax: [phone]   https://www.[email or web address]/  [facility]: [address] [place], [place], [place] http://www.kellinfoundation.org/support-groups.html   [facility]: [place], [place], [place] Mon, [place], Fri: 9:00am-9:00pm; [place]: 9:00am-6:00pm [phone]   https://[email or web address]/  *Habla Espanol   [facility]: [address], [place], [place] Mon-Thurs 9am-6pm, Fri 9am-2pm [phone]  http://triadbehavioralresources.com/ *Accepts Medicaid  [place]: [id number] [place], [place], [place] fax) [phone]  https://youthhavenservices.com/    *Accepts Medicaid  [facility]: [place], [place], [place], [place] / Fax: [phone]  https://[email or web address].html                                                               [place] ADS: Alcohol and Drug Services: [address] [place], [place] fax: [place]: [address], [place], [place] Mon-Fri [place] [phone]/ Fax: [phone]  http://www.garcia-[email or web address]/ *Accepts Medicare, Medicaid  [facility]: [address] [place] www.caringservices.South [place]: [facility] Treatment Center: [address] [place], [place]                                                                 [place].: [place], [place], [place] [address] Phone: [phone] https://hahn.com/ *Accepts [facility]                                                                 [facility] : [address] [place], [place]  [phone]/ [phone] https://rhahealthservices.org/ *Accepts Medicaid                                                        [place].: [address] [place] or [phone]  Fax: [place] Medicaid [place]: [address] [place] https://[email or web address]  *Accepts Medicare, Medicaid    These referrals have been provided to  you as appropriate for your clinical needs while taking into account your financial concerns. Please be aware that agencies, practitioners and insurance companies sometimes change contracts. When calling to make an appointment have your insurance information available so the professional you are going to see  can confirm whether they are covered by your plan. Take this form with you in case the person you are seeing needs a copy or to contact us.  Primary care offices accepting new patients:   Primary Care at [facility] [address] [place], [place] [address] [phone]  [facility] [place], [place] [address] [place] and [facility] [address] [place], [place] [address] [place] [address] [place], [place] [address] [phone]

## 2019-12-10 ENCOUNTER — Telehealth: Payer: Self-pay | Admitting: Clinical

## 2019-12-10 NOTE — Telephone Encounter (Signed)
Follow up with patient; Left HIPPA-compliant message to call back Roselyn Reef from Center for [employer] at [facility] at [phone] (main line) or [phone] ([place] office).

## 2020-01-31 ENCOUNTER — Ambulatory Visit (INDEPENDENT_AMBULATORY_CARE_PROVIDER_SITE_OTHER): Payer: 59 | Admitting: Obstetrics and Gynecology

## 2020-01-31 ENCOUNTER — Other Ambulatory Visit: Payer: Self-pay

## 2020-01-31 ENCOUNTER — Encounter: Payer: Self-pay | Admitting: Obstetrics and Gynecology

## 2020-01-31 VITALS — BP 124/82 | HR 72 | Wt 229.4 lb

## 2020-01-31 DIAGNOSIS — N939 Abnormal uterine and vaginal bleeding, unspecified: Secondary | ICD-10-CM

## 2020-01-31 DIAGNOSIS — Z3046 Encounter for surveillance of implantable subdermal contraceptive: Secondary | ICD-10-CM

## 2020-01-31 DIAGNOSIS — Z3202 Encounter for pregnancy test, result negative: Secondary | ICD-10-CM | POA: Diagnosis not present

## 2020-01-31 HISTORY — PX: REMOVAL OF IMPLANON ROD: OBO 1006

## 2020-01-31 LAB — POCT URINE PREGNANCY: Preg Test, Ur: NEGATIVE

## 2020-01-31 MED ORDER — FLUCONAZOLE 150 MG PO TABS: 150.0000 mg | ORAL_TABLET | Freq: Once | ORAL | 0 refills | Status: AC

## 2020-01-31 MED ORDER — METRONIDAZOLE 500 MG PO TABS: 500.0000 mg | ORAL_TABLET | Freq: Two times a day (BID) | ORAL | 0 refills | Status: AC

## 2020-01-31 NOTE — Procedures (Signed)
Nexplanon Removal Procedure Note AUB and headaches with nexplanon. Pt breast feeding and supplementing currently. UPT negative.  Prior to the procedure being performed, the patient (or guardian) was asked to state their full name, date of birth, type of procedure being performed and the exact location of the operative site. This information was then checked against the documentation in the patient's chart. Prior to the procedure being performed, a "time out" was performed by the physician that confirmed the correct patient, procedure and site.  After informed consent was obtained, the patient's left arm was examined and the Nexplanon rod was noted to be easily palpable. The area was cleaned with alcohol then local anesthesia was infiltrated with 3 ml of 1% lidocaine with epinephrine. The area was prepped with betadine. Using sterile technique, the Nexplanon device was brought to the incision site. The capsule was scrapped off with the scalpel, the Nexplanon grasped with hemostats, and easily removed; the removal site was hemostatic. The Nexplanon was inspected and noted to be intact.  A steri-strip and a pressure dressing was applied.  The patient tolerated the procedure well.  She chose to do condoms for birth control. Contraception options d/w her, namely Paragard, Mirena and skyla IUD d/w pt and brochures given. H/o OCPs after her myomectomy and did well with that  Flagyl and diflucan sent in for vaginal yeast infection s/s.   Pt to let us know if periods don't go back to normal after two cycles for work up.   Durene Romans MD Attending Center for [employer] Fish farm manager)

## 2020-08-05 ENCOUNTER — Encounter: Payer: Self-pay | Admitting: *Deleted

## 2020-08-17 ENCOUNTER — Emergency Department
Admission: EM | Admit: 2020-08-17 | Discharge: 2020-08-17 | Disposition: A | Discharge status: Home / Self Care | Payer: 59 | Attending: Emergency Medicine | Admitting: Emergency Medicine

## 2020-08-17 ENCOUNTER — Other Ambulatory Visit: Payer: Self-pay

## 2020-08-17 ENCOUNTER — Encounter: Payer: Self-pay | Admitting: Emergency Medicine

## 2020-08-17 DIAGNOSIS — K2900 Acute gastritis without bleeding: Secondary | ICD-10-CM

## 2020-08-17 DIAGNOSIS — Z79899 Other long term (current) drug therapy: Secondary | ICD-10-CM | POA: Insufficient documentation

## 2020-08-17 DIAGNOSIS — O26891 Other specified pregnancy related conditions, first trimester: Secondary | ICD-10-CM | POA: Insufficient documentation

## 2020-08-17 DIAGNOSIS — Z3A01 Less than 8 weeks gestation of pregnancy: Secondary | ICD-10-CM | POA: Diagnosis not present

## 2020-08-17 DIAGNOSIS — K5900 Constipation, unspecified: Secondary | ICD-10-CM | POA: Insufficient documentation

## 2020-08-17 LAB — COMPREHENSIVE METABOLIC PANEL
ALT: 10 U/L (ref 0–44)
AST: 15 U/L (ref 15–41)
Albumin: 4 g/dL (ref 3.5–5.0)
Alkaline Phosphatase: 51 U/L (ref 38–126)
Anion gap: 10 (ref 5–15)
BUN: 9 mg/dL (ref 6–20)
CO2: 25 mmol/L (ref 22–32)
Calcium: 9.5 mg/dL (ref 8.9–10.3)
Chloride: 101 mmol/L (ref 98–111)
Creatinine, Ser: 0.75 mg/dL (ref 0.44–1.00)
GFR, Estimated: >60 mL/min (ref >60)
Glucose, Bld: 116 mg/dL — ABNORMAL HIGH (ref 70–99)
Potassium: 3.6 mmol/L (ref 3.5–5.1)
Sodium: 136 mmol/L (ref 135–145)
Total Bilirubin: 0.5 mg/dL (ref 0.3–1.2)
Total Protein: 7.3 g/dL (ref 6.5–8.1)

## 2020-08-17 LAB — CBC
HCT: 36.6 % (ref 36.0–46.0)
Hemoglobin: 12.9 g/dL (ref 12.0–15.0)
MCH: 31.6 pg (ref 26.0–34.0)
MCHC: 35.2 g/dL (ref 30.0–36.0)
MCV: 89.7 fL (ref 80.0–100.0)
Platelets: 318 10*3/uL (ref 150–400)
RBC: 4.08 MIL/uL (ref 3.87–5.11)
RDW: 12.6 % (ref 11.5–15.5)
WBC: 8.9 10*3/uL (ref 4.0–10.5)
nRBC: 0 % (ref 0.0–0.2)

## 2020-08-17 LAB — LIPASE, BLOOD: Lipase: 39 U/L (ref 11–51)

## 2020-08-17 MED ORDER — SUCRALFATE 1 G PO TABS: 1.0000 g | ORAL_TABLET | Freq: Four times a day (QID) | ORAL | 0 refills | Status: DC

## 2020-08-17 MED ORDER — POLYETHYLENE GLYCOL 3350 17 G PO PACK: 17.0000 g | PACK | Freq: Every day | ORAL | 0 refills | Status: DC

## 2020-08-17 NOTE — ED Provider Notes (Signed)
[facility] ([place]) Emergency Department Provider Note   ____________________________________________    I have reviewed the triage vital signs and the nursing notes.   HISTORY  Chief Complaint Abdominal Pain     HPI Paige Davidson is a 30 y.o. female reports she is approximately [redacted] weeks pregnant who presents with complaints of upper abdominal pain.  Patient reports the pain became severe earlier today.  She reports it may be related to constipation she has not had a normal bowel movement in 3 days.  She has not take anything for this.  No fevers chills cough or chest pain.  Currently is feeling much better  Past Medical History:  Diagnosis Date  . Fibroids   . 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 Pregnancy with history of uterine myomectomy 05/31/2019  . Size > Dates 04/03/2019   [ ]  f/u mfm scan, needs serial growths -9/1: efw 87%, normal ac, afi -9/21:  EFW 65th% AFI WNL  . Status post primary low transverse cesarean section 06/02/2019   Hx myomectomy, tx as classical  . Supervision of high risk pregnancy, antepartum 11/03/2018   Clinic [facility] Prenatal Labs Dating LMP Blood type: --/--/B POS Performed at [facility], [address] [place], [place] [address]  [phone])  Genetic Screen 1 Screen: Negative         Antibody:Negative (03/09 0952) Anatomic [place] Complete 7/6 Rubella: 3.01 (03/09 3716) Varicella: Immune GTT Early:               Third trimester: wnl RPR: Non Reactive (03/09 0952)  Rhogam N/A HBsAg:     Patient Active Problem List   Diagnosis Date Noted  . Nexplanon insertion 07/04/2019  . History of myomectomy 04/30/2019  . 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 History of ectopic pregnancy 10/16/2018    Past Surgical History:  Procedure Laterality Date  . CESAREAN SECTION N/A 05/31/2019   Procedure: CESAREAN SECTION;  Surgeon: Mora Bellman, MD;  Location: [facility] LD ORS;  Service: Obstetrics;  Laterality: N/A;   . CYSTECTOMY    . MYOMECTOMY  12/2018   [place] Dr. [name]. Sounds robotic assisted. pt states was told okay to labor  . REMOVAL OF IMPLANON ROD  01/31/2020  . surgery on left ear     bone removal    Prior to Admission medications   Medication Sig Start Date End Date Taking? Authorizing Provider  polyethylene glycol (MIRALAX) 17 g packet Take 17 g by mouth daily. 08/17/20  Yes Lavonia Drafts, MD  sucralfate (CARAFATE) 1 g tablet Take 1 tablet (1 g total) by mouth 4 (four) times daily for 15 days. 08/17/20 09/01/20 Yes Lavonia Drafts, MD  acetaminophen (TYLENOL) 500 MG tablet Take 500 mg by mouth every 6 (six) hours as needed for moderate pain. Patient not taking: Reported on 01/31/2020    [provider]  diphenhydramine-acetaminophen (TYLENOL PM) 25-500 MG TABS tablet Take 1 tablet by mouth at bedtime as needed (SLEEP). Patient not taking: Reported on 01/31/2020    [provider]  doxylamine, Sleep, (UNISOM) 25 MG tablet Take 25 mg by mouth at bedtime as needed for sleep. Patient not taking: Reported on 01/31/2020    [provider]  ibuprofen (ADVIL) 800 MG tablet Take 1 tablet (800 mg total) by mouth every 8 (eight) hours. Patient not taking: Reported on 06/14/2019 06/02/19   Elvera Maria, CNM  oxyCODONE-acetaminophen (PERCOCET/ROXICET) 5-325 MG tablet Take 2 tablets by  mouth every 6 (six) hours as needed for moderate pain. Patient not taking: Reported on 06/14/2019 06/02/19   Leftwich-Kirby, Kathie Dike, CNM  Prenat w/o A Vit-FeFum-FePo-FA (CONCEPT OB) 130-92.4-1 MG CAPS Take 1 capsule by mouth daily. 12/12/18   [provider]  sertraline (ZOLOFT) 50 MG tablet Take 0.5 tablets (25 mg total) by mouth daily. 07/25/19   Darlina Rumpf, CNM     Allergies Penicillins  Family History  Problem Relation Age of Onset  . Breast cancer Maternal Grandmother   . Diabetes Maternal Grandfather   . Hypertension Paternal Grandfather   . Breast  cancer Paternal Aunt   . Breast cancer Paternal Aunt     Social History Social History   Tobacco Use  . Smoking status: Never Smoker  . Smokeless tobacco: Never Used  Vaping Use  . Vaping Use: Former  Substance Use Topics  . Alcohol use: Not Currently    Comment: occasionally   . Drug use: No    Review of Systems  Constitutional: No fever/chills Eyes: No visual changes.  ENT: No sore throat. Cardiovascular: Denies chest pain. Respiratory: Denies shortness of breath. Gastrointestinal: As Genitourinary: Negative for dysuria. Musculoskeletal: Negative for back pain. Skin: Negative for rash. Neurological: Negative for headaches   ____________________________________________   PHYSICAL EXAM:  VITAL SIGNS: ED Triage Vitals  Enc Vitals Group     BP 08/17/20 1700 107/76     Pulse Rate 08/17/20 1700 74     Resp 08/17/20 1700 20     Temp 08/17/20 1959 98.5 F (36.9 C)     Temp Source 08/17/20 1700 Oral     SpO2 08/17/20 1700 100 %     Weight 08/17/20 1700 104.3 kg (230 lb)     Height 08/17/20 1700 1.676 m (5\' 6" )     Head Circumference --      Peak Flow --      Pain Score 08/17/20 1700 7     Pain Loc --      Pain Edu? --      Excl. in [place]? --     Constitutional: Alert and oriented.   Nose: No congestion/rhinnorhea. Mouth/Throat: Mucous membranes are moist.    Cardiovascular: Normal rate, regular rhythm.  Good peripheral circulation. Respiratory: Normal respiratory effort.  No retractions.  Gastrointestinal: Soft and nontender. No distention.  No CVA tenderness.  Musculoskeletal: No lower extremity tenderness nor edema.  Warm and well perfused Neurologic:  Normal speech and language. No gross focal neurologic deficits are appreciated.  Skin:  Skin is warm, dry and intact. No rash noted. Psychiatric: Mood and affect are normal. Speech and behavior are normal.  ____________________________________________   LABS (all labs ordered are listed, but only  abnormal results are displayed)  Labs Reviewed  COMPREHENSIVE METABOLIC PANEL - Abnormal; Notable for the following components:      Result Value   Glucose, Bld 116 (*)    All other components within normal limits  LIPASE, BLOOD  CBC  URINALYSIS, COMPLETE (UACMP) WITH MICROSCOPIC  POC URINE PREG, ED   ____________________________________________  EKG  ED ECG REPORT I, Lavonia Drafts, the attending physician, personally viewed and interpreted this ECG.  Date: 08/17/2020  Rhythm: normal sinus rhythm QRS Axis: normal Intervals: normal ST/T Wave abnormalities: normal Narrative Interpretation: no evidence of acute ischemia  ____________________________________________  RADIOLOGY  None ____________________________________________   PROCEDURES  Procedure(s) performed: No  Procedures   Critical Care performed: No ____________________________________________   INITIAL IMPRESSION / ASSESSMENT AND PLAN /  ED COURSE  Pertinent labs & imaging results that were available during my care of the patient were reviewed by me and considered in my medical decision making (see chart for details).  Patient presents with epigastric abdominal pain as described above.  Differential includes gastritis/GERD related to pregnancy, constipation, less likely pancreatitis or cholelithiasis  LFTs are normal.  Lipase is normal.  White blood cell count and CBC is normal.  Patient is feeling much better without intervention here, will start her on Carafate as well as MiraLAX, outpatient follow-up with OB.    ____________________________________________   FINAL CLINICAL IMPRESSION(S) / ED DIAGNOSES  Final diagnoses:  Constipation, unspecified constipation type  Acute gastritis without hemorrhage, unspecified gastritis type        Note:  This document was prepared using Dragon voice recognition software and may include unintentional dictation errors.   Lavonia Drafts, MD 08/17/20  [phone]

## 2020-08-17 NOTE — ED Triage Notes (Signed)
Pt to ED via EMS with c/o upper abdominal pain x several days. Pt states +N/V/D, pt states is approx [redacted] weeks pregnant. Pt also c/o constipation after having diarrhea last week. Pt A&O x4. VSS in triage.

## 2020-08-17 NOTE — ED Notes (Signed)
IV L ac #20 removed. Placed by EMS. Cath intact

## 2020-08-17 NOTE — ED Triage Notes (Signed)
Pt in via EMS from with c/o upper abd pain at [redacted] weeks pregnant for past 3 days. G3P1

## 2020-08-18 ENCOUNTER — Encounter: Payer: Self-pay | Admitting: Radiology

## 2020-09-01 ENCOUNTER — Ambulatory Visit (INDEPENDENT_AMBULATORY_CARE_PROVIDER_SITE_OTHER): Payer: 59 | Admitting: Obstetrics and Gynecology

## 2020-09-01 ENCOUNTER — Other Ambulatory Visit: Payer: Self-pay

## 2020-09-01 ENCOUNTER — Encounter: Payer: Self-pay | Admitting: Obstetrics and Gynecology

## 2020-09-01 ENCOUNTER — Other Ambulatory Visit (HOSPITAL_COMMUNITY)
Admission: RE | Admit: 2020-09-01 | Discharge: 2020-09-01 | Disposition: A | Discharge status: Home / Self Care | Payer: 59 | Source: Ambulatory Visit | Attending: Obstetrics and Gynecology | Admitting: Obstetrics and Gynecology

## 2020-09-01 VITALS — BP 121/80 | HR 80 | Wt 222.0 lb

## 2020-09-01 DIAGNOSIS — E669 Obesity, unspecified: Secondary | ICD-10-CM | POA: Insufficient documentation

## 2020-09-01 DIAGNOSIS — O0991 Supervision of high risk pregnancy, unspecified, first trimester: Secondary | ICD-10-CM | POA: Insufficient documentation

## 2020-09-01 DIAGNOSIS — Z98891 History of uterine scar from previous surgery: Secondary | ICD-10-CM

## 2020-09-01 DIAGNOSIS — O99210 Obesity complicating pregnancy, unspecified trimester: Secondary | ICD-10-CM | POA: Insufficient documentation

## 2020-09-01 DIAGNOSIS — Z3A11 11 weeks gestation of pregnancy: Secondary | ICD-10-CM | POA: Diagnosis not present

## 2020-09-01 DIAGNOSIS — Z3480 Encounter for supervision of other normal pregnancy, unspecified trimester: Secondary | ICD-10-CM

## 2020-09-01 DIAGNOSIS — Z9889 Other specified postprocedural states: Secondary | ICD-10-CM

## 2020-09-01 DIAGNOSIS — B373 Candidiasis of vulva and vagina: Secondary | ICD-10-CM | POA: Insufficient documentation

## 2020-09-01 DIAGNOSIS — O98811 Other maternal infectious and parasitic diseases complicating pregnancy, first trimester: Secondary | ICD-10-CM | POA: Insufficient documentation

## 2020-09-01 DIAGNOSIS — N736 Female pelvic peritoneal adhesions (postinfective): Secondary | ICD-10-CM

## 2020-09-01 HISTORY — DX: Obesity, unspecified: E66.9

## 2020-09-01 MED ORDER — BONJESTA 20-20 MG PO TBCR: 1.0000 | EXTENDED_RELEASE_TABLET | Freq: Every day | ORAL | 1 refills | Status: DC

## 2020-09-01 MED ORDER — PROMETHAZINE HCL 25 MG PO TABS: 25.0000 mg | ORAL_TABLET | Freq: Four times a day (QID) | ORAL | 2 refills | Status: DC | PRN

## 2020-09-01 NOTE — Progress Notes (Signed)
New OB Note  09/01/2020   Clinic: [facility]  Chief Complaint: NOB  Transfer of Care Patient: no  History of Present Illness: [name] is a 30 y.o. G3P1011 @ 11/3 weeks ([place] 8/12 [tentative until anatomy u/s], based on 11wk u/s in office today) Patient's last menstrual period was 06/23/2020 (exact date).Preg complicated by has History of ectopic pregnancy; Supervision of high risk pregnancy in first trimester; Pelvic adhesive disease; History of myomectomy; History of classical cesarean section; Nexplanon insertion; Supervision of other normal pregnancy, antepartum; Obesity (BMI 30-39.9); and Obesity in pregnancy, antepartum on their problem list.   Any events prior to today's visit: no Her periods were: qmonth, regular She was using no method when she conceived.  She has mild to moderate signs or symptoms of nausea/vomiting of pregnancy. She has Negative signs or symptoms of miscarriage or preterm labor  ROS: A 12-point review of systems was performed and negative, except as stated in the above HPI.  OBGYN History: As per HPI. OB History  Gravida Para Term Preterm AB Living  3 1 1   1 1   SAB IAB Ectopic Multiple Live Births      1 0 1    # Outcome Date GA Lbr Len/2nd Weight Sex Delivery Anes PTL Lv  3 Current           2 Term 05/31/19 [redacted]w[redacted]d  7 lb 2.1 oz (3.235 kg) F CS-LTranv Spinal  LIV  1 Ectopic             Obstetric Comments  G1: ectopic pregnancy (tx with methotrexate)      Past Medical History: Past Medical History:  Diagnosis Date  . Fibroids   . 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 Pregnancy with history of uterine myomectomy 05/31/2019  . Size > Dates 04/03/2019   [ ]  f/u mfm scan, needs serial growths -9/1: efw 87%, normal ac, afi -9/21:  EFW 65th% AFI WNL  . Status post primary low transverse cesarean section 06/02/2019   Hx myomectomy, tx as classical  . Supervision of high risk pregnancy, antepartum  11/03/2018   Clinic [facility] Prenatal Labs Dating LMP Blood type: --/--/B POS Performed at [facility], [address] [place], [place] [address]  [phone])  Genetic Screen 1 Screen: Negative         Antibody:Negative (03/09 0952) Anatomic [place] Complete 7/6 Rubella: 3.01 (03/09 [id number]) Varicella: Immune GTT Early:               Third trimester: wnl RPR: Non Reactive (03/09 0952)  Rhogam N/A HBsAg:     Past Surgical History: Past Surgical History:  Procedure Laterality Date  . CESAREAN SECTION N/A 05/31/2019   Procedure: CESAREAN SECTION;  Surgeon: Mora Bellman, MD;  Location: [place] LD ORS;  Service: Obstetrics;  Laterality: N/A;  . CYSTECTOMY    . MYOMECTOMY  12/2018   [place] Dr. [name]. Sounds robotic assisted. pt states was told okay to labor  . REMOVAL OF IMPLANON ROD  01/31/2020  . surgery on left ear     bone removal    Family History:  Family History  Problem Relation Age of Onset  . Breast cancer Maternal Grandmother   . Diabetes Maternal Grandfather   . Hypertension Paternal Grandfather   . Breast cancer Paternal Aunt   . Breast cancer Paternal Aunt     Social History:  Social History   Socioeconomic History  . Marital  status: Married    Spouse name: Not on file  . Number of children: Not on file  . Years of education: Not on file  . Highest education level: Not on file  Occupational History  . Not on file  Tobacco Use  . Smoking status: Never Smoker  . Smokeless tobacco: Never Used  Vaping Use  . Vaping Use: Former  Substance and Sexual Activity  . Alcohol use: Not Currently    Comment: occasionally   . Drug use: No  . Sexual activity: Yes    Birth control/protection: None  Other Topics Concern  . Not on file  Social History Narrative  . Not on file   Social Determinants of Health   Financial Resource Strain: Not on file  Food Insecurity: Not on file  Transportation Needs: Not on file  Physical Activity: Not on file  Stress:  Not on file  Social Connections: Not on file  Intimate Partner Violence: Not on file    Allergy: Allergies  Allergen Reactions  . Penicillins Hives    Did it involve swelling of the face/tongue/throat, SOB, or low BP? No Did it involve sudden or severe rash/hives, skin peeling, or any reaction on the inside of your mouth or nose? Yes Did you need to seek medical attention at a hospital or doctor's office? Unknown When did it last happen?5+ years If all above answers are "NO", may proceed with cephalosporin use.     Current Outpatient Medications: Prenatal vitamin  Physical Exam:   BP 121/80   Pulse 80   Wt 222 lb (100.7 kg)   LMP 06/23/2020 (Exact Date)   BMI 35.83 kg/m  Body mass index is 35.83 kg/m. Contractions: Not present Vag. Bleeding: None. Fundal height: not applicable FHTs: 093O  General appearance: Well nourished, well developed female in no acute distress.  Neck:  Supple, normal appearance, and no thyromegaly  Cardiovascular: S1, S2 normal, no murmur, rub or gallop, regular rate and rhythm Respiratory:  Clear to auscultation bilateral. Normal respiratory effort Abdomen: positive bowel sounds and no masses, hernias; diffusely non tender to palpation, non distended Breasts: no breast s/s. Neuro/Psych:  Normal mood and affect.  Skin:  Warm and dry.   Pelvic exam: deferred  Laboratory: none  Imaging:  Bedside u/s c/w CRL of 11/13 for SLIUP, FHR 160s  Assessment: pt doing well  Plan: 1. Supervision of high risk pregnancy in first trimester Routine care. Declines genetics. Prn phenergan sent in. Rx sent for diclegis and bonjesta too expensive when sent in by urgent care. Will try again and see if PA needs to be approved - CBC/D/Plt+RPR+Rh+ABO+Rub Ab... - Culture, OB Urine - Cervicovaginal ancillary only - Protein / creatinine ratio, urine - Hemoglobin A1c - TSH - Comprehensive metabolic panel - [place] MFM OB DETAIL +14 WK; Future  2. Obesity  (BMI 30-39.9)  3. Obesity in pregnancy, antepartum - Protein / creatinine ratio, urine - Hemoglobin A1c - TSH - Comprehensive metabolic panel  4. Supervision of other normal pregnancy, antepartum  5. History of myomectomy Will need rpt c/s at 37wks  6. History of cesarean section Will need rpt c/s at 37wks  7. Pelvic adhesive disease  Problem list reviewed and updated.  Follow up in 4 weeks.  The nature of [facility] with multiple MDs and other Advanced Practice Providers was explained to patient; also emphasized that residents, students are part of our team.  >50% of 30 min visit spent on counseling and  coordination of care.     Durene Romans MD Attending [facility] for [facility])

## 2020-09-01 NOTE — Progress Notes (Unsigned)
Needs meds for nausea    DATING AND VIABILITY SONOGRAM   Paige Davidson is a 30 y.o. year old G5P1011 with LMP Patient's last menstrual period was 06/23/2020 (exact date). which would correlate to  [redacted]w[redacted]d weeks gestation.  She has regular menstrual cycles.   She is here today for a confirmatory initial sonogram.    GESTATION: SINGLETON yes     FETAL ACTIVITY:          Heart rate   164          The fetus is active.   GESTATIONAL AGE AND  BIOMETRICS:  Gestational criteria: Estimated Date of Delivery: 03/20/21 by early ultrasound now at [redacted]w[redacted]d  Previous Scans:0  GESTATIONAL SAC            mm          weeks  CROWN RUMP LENGTH           4.63cm         11.3 weeks                                                   AVERAGE EGA(BY THIS SCAN):  11.3 weeks  WORKING EDD( early ultrasound ):  03/20/2021     TECHNICIAN COMMENTS:  Patient informed that the ultrasound is considered a limited obstetric ultrasound and is not intended to be a complete ultrasound exam.  Patient also informed that the ultrasound is not being completed with the intent of assessing for fetal or placental anomalies or any pelvic abnormalities. Explained that the purpose of today's ultrasound is to assess for fetal heart rate.  Patient acknowledges the purpose of the exam and the limitations of the study.           A copy of this report including all images has been saved and backed up to a second source for retrieval if needed. All measures and details of the anatomical scan, placentation, fluid volume and pelvic anatomy are contained in that report.  Crosby Oyster 09/01/2020 2:31 PM

## 2020-09-02 ENCOUNTER — Encounter: Payer: Self-pay | Admitting: *Deleted

## 2020-09-02 ENCOUNTER — Other Ambulatory Visit: Payer: Self-pay | Admitting: *Deleted

## 2020-09-02 LAB — CBC/D/PLT+RPR+RH+ABO+RUB AB...
Antibody Screen: NEGATIVE
Basophils Absolute: 0 10*3/uL (ref 0.0–0.2)
Basos: 1 %
EOS (ABSOLUTE): 0.3 10*3/uL (ref 0.0–0.4)
Eos: 5 %
HCV Ab: 0.1 s/co ratio (ref 0.0–0.9)
HIV Screen 4th Generation wRfx: NONREACTIVE
Hematocrit: 41.3 % (ref 34.0–46.6)
Hemoglobin: 14.1 g/dL (ref 11.1–15.9)
Hepatitis B Surface Ag: NEGATIVE
Immature Grans (Abs): 0 10*3/uL (ref 0.0–0.1)
Immature Granulocytes: 0 %
Lymphocytes Absolute: 2.9 10*3/uL (ref 0.7–3.1)
Lymphs: 40 %
MCH: 31.1 pg (ref 26.6–33.0)
MCHC: 34.1 g/dL (ref 31.5–35.7)
MCV: 91 fL (ref 79–97)
Monocytes Absolute: 0.5 10*3/uL (ref 0.1–0.9)
Monocytes: 7 %
Neutrophils Absolute: 3.5 10*3/uL (ref 1.4–7.0)
Neutrophils: 47 %
Platelets: 292 10*3/uL (ref 150–450)
RBC: 4.53 x10E6/uL (ref 3.77–5.28)
RDW: 12.8 % (ref 11.7–15.4)
RPR Ser Ql: NONREACTIVE
Rh Factor: POSITIVE
Rubella Antibodies, IGG: 3.08 index (ref >0.99)
WBC: 7.3 10*3/uL (ref 3.4–10.8)

## 2020-09-02 LAB — COMPREHENSIVE METABOLIC PANEL
ALT: 9 IU/L (ref 0–32)
AST: 7 IU/L (ref 0–40)
Albumin/Globulin Ratio: 1.5 (ref 1.2–2.2)
Albumin: 4.5 g/dL (ref 3.8–4.8)
Alkaline Phosphatase: 79 IU/L (ref 44–121)
BUN/Creatinine Ratio: 11 (ref 9–23)
BUN: 7 mg/dL (ref 6–20)
Bilirubin Total: 0.2 mg/dL (ref 0.0–1.2)
CO2: 22 mmol/L (ref 20–29)
Calcium: 9.5 mg/dL (ref 8.7–10.2)
Chloride: 97 mmol/L (ref 96–106)
Creatinine, Ser: 0.62 mg/dL (ref 0.57–1.00)
GFR calc Af Amer: 139 mL/min/{1.73_m2} (ref >59)
GFR calc non Af Amer: 121 mL/min/{1.73_m2} (ref >59)
Globulin, Total: 3.1 g/dL (ref 1.5–4.5)
Glucose: 79 mg/dL (ref 65–99)
Potassium: 4 mmol/L (ref 3.5–5.2)
Sodium: 134 mmol/L (ref 134–144)
Total Protein: 7.6 g/dL (ref 6.0–8.5)

## 2020-09-02 LAB — PROTEIN / CREATININE RATIO, URINE
Creatinine, Urine: 186.3 mg/dL
Protein, Ur: 38.4 mg/dL
Protein/Creat Ratio: 206 mg/g creat — ABNORMAL HIGH (ref 0–200)

## 2020-09-02 LAB — TSH: TSH: 1.42 u[IU]/mL (ref 0.450–4.500)

## 2020-09-02 LAB — HCV INTERPRETATION

## 2020-09-02 LAB — HEMOGLOBIN A1C
Est. average glucose Bld gHb Est-mCnc: 111 mg/dL
Hgb A1c MFr Bld: 5.5 % (ref 4.8–5.6)

## 2020-09-02 MED ORDER — PYRIDOXINE HCL 25 MG PO TABS: 25.0000 mg | ORAL_TABLET | Freq: Three times a day (TID) | ORAL | 3 refills | Status: DC | PRN

## 2020-09-02 MED ORDER — DOXYLAMINE SUCCINATE (SLEEP) 25 MG PO TABS: 25.0000 mg | ORAL_TABLET | Freq: Every day | ORAL | 1 refills | Status: DC

## 2020-09-03 LAB — CULTURE, OB URINE

## 2020-09-03 LAB — CERVICOVAGINAL ANCILLARY ONLY
Bacterial Vaginitis (gardnerella): NEGATIVE
Candida Glabrata: NEGATIVE
Candida Vaginitis: POSITIVE — AB
Chlamydia: NEGATIVE
Comment: NEGATIVE
Comment: NEGATIVE
Comment: NEGATIVE
Comment: NEGATIVE
Comment: NEGATIVE
Comment: NORMAL
Neisseria Gonorrhea: NEGATIVE
Trichomonas: NEGATIVE

## 2020-09-03 LAB — URINE CULTURE, OB REFLEX

## 2020-09-24 ENCOUNTER — Other Ambulatory Visit: Payer: Self-pay | Admitting: Obstetrics and Gynecology

## 2020-09-24 MED ORDER — CLOTRIMAZOLE 1 % EX OINT: 1.0000 | TOPICAL_OINTMENT | Freq: Every day | CUTANEOUS | 1 refills | Status: AC

## 2020-09-24 NOTE — Progress Notes (Signed)
.  ter 

## 2020-09-25 ENCOUNTER — Other Ambulatory Visit (HOSPITAL_COMMUNITY)
Admission: RE | Admit: 2020-09-25 | Discharge: 2020-09-25 | Disposition: A | Discharge status: Home / Self Care | Payer: 59 | Source: Ambulatory Visit | Attending: Obstetrics & Gynecology | Admitting: Obstetrics & Gynecology

## 2020-09-25 ENCOUNTER — Ambulatory Visit (INDEPENDENT_AMBULATORY_CARE_PROVIDER_SITE_OTHER): Payer: 59 | Admitting: *Deleted

## 2020-09-25 ENCOUNTER — Other Ambulatory Visit: Payer: Self-pay

## 2020-09-25 DIAGNOSIS — N898 Other specified noninflammatory disorders of vagina: Secondary | ICD-10-CM | POA: Insufficient documentation

## 2020-09-25 NOTE — Progress Notes (Signed)
SUBJECTIVE:  30 y.o. female complains of vaginal odor. Did recently treat for yeast, and does not have any itching. Will re swab to see if it may be BV or still yeast.  Denies abnormal vaginal bleeding or significant pelvic pain or fever. No UTI symptoms. Denies history of known exposure to STD.  Patient's last menstrual period was 06/23/2020 (exact date).  OBJECTIVE:  She appears well, afebrile. Urine dipstick: not done.  ASSESSMENT:   Vaginal Odor   PLAN:  BVAG, CVAG probe sent to lab. Treatment: To be determined once lab results are received ROV prn if symptoms persist or worsen.

## 2020-09-26 NOTE — Progress Notes (Signed)
Patient was assessed and managed by nursing staff during this encounter. I have reviewed the chart and agree with the documentation and plan. I have also made any necessary editorial changes.  Verita Schneiders, MD 09/26/2020 7:57 AM

## 2020-09-29 ENCOUNTER — Encounter: Payer: Self-pay | Admitting: Obstetrics and Gynecology

## 2020-09-29 ENCOUNTER — Telehealth: Payer: 59 | Admitting: Obstetrics and Gynecology

## 2020-09-29 LAB — CERVICOVAGINAL ANCILLARY ONLY
Bacterial Vaginitis (gardnerella): NEGATIVE
Candida Glabrata: NEGATIVE
Candida Vaginitis: NEGATIVE
Comment: NEGATIVE
Comment: NEGATIVE
Comment: NEGATIVE

## 2020-09-30 NOTE — Progress Notes (Signed)
Patient did not keep her OB appointment for 09/29/2020.  Durene Romans MD Attending Center for [employer])

## 2020-10-01 ENCOUNTER — Telehealth: Payer: Self-pay | Admitting: Radiology

## 2020-10-01 NOTE — Telephone Encounter (Signed)
Left message for patient to call [facility] to schedule appointment missed on 09/29/20 with Dr Ilda Basset

## 2020-10-13 ENCOUNTER — Telehealth: Payer: Self-pay | Admitting: Radiology

## 2020-10-13 NOTE — Telephone Encounter (Signed)
Left message and sent mychart message for patient to call office about rescheduling missed virtual visit with Dr Ilda Basset.

## 2020-10-16 ENCOUNTER — Ambulatory Visit (INDEPENDENT_AMBULATORY_CARE_PROVIDER_SITE_OTHER): Payer: 59 | Admitting: Family Medicine

## 2020-10-16 ENCOUNTER — Other Ambulatory Visit: Payer: Self-pay

## 2020-10-16 VITALS — BP 111/74 | HR 98 | Wt 232.0 lb

## 2020-10-16 DIAGNOSIS — R319 Hematuria, unspecified: Secondary | ICD-10-CM | POA: Insufficient documentation

## 2020-10-16 DIAGNOSIS — D259 Leiomyoma of uterus, unspecified: Secondary | ICD-10-CM

## 2020-10-16 DIAGNOSIS — Z8659 Personal history of other mental and behavioral disorders: Secondary | ICD-10-CM

## 2020-10-16 DIAGNOSIS — Z809 Family history of malignant neoplasm, unspecified: Secondary | ICD-10-CM | POA: Insufficient documentation

## 2020-10-16 DIAGNOSIS — O3410 Maternal care for benign tumor of corpus uteri, unspecified trimester: Secondary | ICD-10-CM

## 2020-10-16 DIAGNOSIS — O0991 Supervision of high risk pregnancy, unspecified, first trimester: Secondary | ICD-10-CM

## 2020-10-16 DIAGNOSIS — O99891 Other specified diseases and conditions complicating pregnancy: Secondary | ICD-10-CM | POA: Insufficient documentation

## 2020-10-16 HISTORY — DX: Family history of malignant neoplasm, unspecified: Z80.9

## 2020-10-16 NOTE — Patient Instructions (Signed)

## 2020-10-16 NOTE — Progress Notes (Addendum)
   PRENATAL VISIT NOTE  Subjective:  Paige Davidson is a 30 y.o. G3P1011 at [redacted]w[redacted]d being seen today for ongoing prenatal care.  She is currently monitored for the following issues for this high-risk pregnancy and has History of ectopic pregnancy; Supervision of high risk pregnancy in first trimester; Uterine fibroid during pregnancy, antepartum; Pelvic adhesive disease; History of myomectomy; Obesity (BMI 30-39.9); Obesity in pregnancy, antepartum; Blood in urine; Uterine leiomyoma; Family history of cancer; and History of postpartum depression, currently pregnant in second trimester on their problem list.  Patient reports nausea and vomiting.  Contractions: Not present. Vag. Bleeding: None.   . Denies leaking of fluid.   The following portions of the patient's history were reviewed and updated as appropriate: allergies, current medications, past family history, past medical history, past social history, past surgical history and problem list.   Objective:   Vitals:   10/16/20 0848  BP: 111/74  Pulse: 98  Weight: 232 lb (105.2 kg)    Fetal Status: Fetal Heart Rate (bpm): 143         General:  Alert, oriented and cooperative. Patient is in no acute distress.  Skin: Skin is warm and dry. No rash noted.   Cardiovascular: Normal heart rate noted  Respiratory: Normal respiratory effort, no problems with respiration noted  Abdomen: Soft, gravid, appropriate for gestational age.  Pain/Pressure: Present     Pelvic: Cervical exam deferred        Extremities: Normal range of motion.     Mental Status: Normal mood and affect. Normal behavior. Normal judgment and thought content.   Assessment and Plan:  Pregnancy: G3P1011 at [redacted]w[redacted]d 1. Supervision of high risk pregnancy in first trimester AFP today Declined genetics Anatomy u/s scheduled. - AFP, Serum, Open Spina Bifida  2. Uterine fibroid during pregnancy, antepartum Not having as much pain  3. H/o pp depression Discussed R/B/A to  medication and impacts on pregnancy. Will work on self-care, mindfulness, meditation and knows meds are available if needed.  Does report some financial hardships--discussed food  bank and transportation support as needed.  General obstetric precautions including but not limited to vaginal bleeding, contractions, leaking of fluid and fetal movement were reviewed in detail with the patient. Please refer to After Visit Summary for other counseling recommendations.   Return in 4 weeks (on 11/13/2020).  Future Appointments  Date Time Provider May  10/24/2020 10:00 AM [facility] NURSE [facility]  10/24/2020 10:15 AM [facility] US2 [facility]  11/13/2020  3:45 PM Donnamae Jude, MD [facility]    Donnamae Jude, MD

## 2020-10-16 NOTE — Progress Notes (Signed)
Stopped zoloft when she found she was pregnant, is it ok to restart

## 2020-10-18 LAB — AFP, SERUM, OPEN SPINA BIFIDA
AFP MoM: 1.97
AFP Value: 69.3 ng/mL
Gest. Age on Collection Date: 17.6 weeks
Maternal Age At EDD: 32.2 yr
OSBR Risk 1 IN: 1723
Test Results:: NEGATIVE
Weight: 232 [lb_av]

## 2020-10-24 ENCOUNTER — Other Ambulatory Visit: Payer: Self-pay | Admitting: *Deleted

## 2020-10-24 ENCOUNTER — Other Ambulatory Visit: Payer: Self-pay

## 2020-10-24 ENCOUNTER — Ambulatory Visit: Payer: 59 | Attending: Obstetrics and Gynecology

## 2020-10-24 ENCOUNTER — Ambulatory Visit: Payer: 59 | Admitting: *Deleted

## 2020-10-24 ENCOUNTER — Encounter: Payer: Self-pay | Admitting: *Deleted

## 2020-10-24 DIAGNOSIS — Z3A19 19 weeks gestation of pregnancy: Secondary | ICD-10-CM | POA: Diagnosis not present

## 2020-10-24 DIAGNOSIS — O0991 Supervision of high risk pregnancy, unspecified, first trimester: Secondary | ICD-10-CM | POA: Insufficient documentation

## 2020-10-24 DIAGNOSIS — O3412 Maternal care for benign tumor of corpus uteri, second trimester: Secondary | ICD-10-CM | POA: Diagnosis not present

## 2020-10-24 DIAGNOSIS — O3429 Maternal care due to uterine scar from other previous surgery: Secondary | ICD-10-CM

## 2020-10-24 DIAGNOSIS — Z8659 Personal history of other mental and behavioral disorders: Secondary | ICD-10-CM | POA: Insufficient documentation

## 2020-10-24 DIAGNOSIS — E669 Obesity, unspecified: Secondary | ICD-10-CM

## 2020-10-24 DIAGNOSIS — Z363 Encounter for antenatal screening for malformations: Secondary | ICD-10-CM | POA: Insufficient documentation

## 2020-10-24 DIAGNOSIS — O99212 Obesity complicating pregnancy, second trimester: Secondary | ICD-10-CM

## 2020-10-24 DIAGNOSIS — O34219 Maternal care for unspecified type scar from previous cesarean delivery: Secondary | ICD-10-CM | POA: Diagnosis not present

## 2020-10-24 DIAGNOSIS — D259 Leiomyoma of uterus, unspecified: Secondary | ICD-10-CM | POA: Insufficient documentation

## 2020-10-24 DIAGNOSIS — Z3689 Encounter for other specified antenatal screening: Secondary | ICD-10-CM | POA: Diagnosis not present

## 2020-10-24 DIAGNOSIS — O99891 Other specified diseases and conditions complicating pregnancy: Secondary | ICD-10-CM

## 2020-11-13 ENCOUNTER — Other Ambulatory Visit: Payer: Self-pay

## 2020-11-13 ENCOUNTER — Ambulatory Visit (INDEPENDENT_AMBULATORY_CARE_PROVIDER_SITE_OTHER): Payer: 59 | Admitting: Family Medicine

## 2020-11-13 VITALS — BP 112/73 | HR 99 | Wt 236.0 lb

## 2020-11-13 DIAGNOSIS — O0991 Supervision of high risk pregnancy, unspecified, first trimester: Secondary | ICD-10-CM

## 2020-11-13 DIAGNOSIS — Z8659 Personal history of other mental and behavioral disorders: Secondary | ICD-10-CM

## 2020-11-13 DIAGNOSIS — O99891 Other specified diseases and conditions complicating pregnancy: Secondary | ICD-10-CM

## 2020-11-13 NOTE — Patient Instructions (Signed)

## 2020-11-13 NOTE — Progress Notes (Signed)
   PRENATAL VISIT NOTE  Subjective:  Paige Davidson is a 30 y.o. G3P1011 at [redacted]w[redacted]d being seen today for ongoing prenatal care.  She is currently monitored for the following issues for this high-risk pregnancy and has History of ectopic pregnancy; Supervision of high risk pregnancy in first trimester; Uterine fibroid during pregnancy, antepartum; Pelvic adhesive disease; History of myomectomy; Obesity (BMI 30-39.9); Obesity in pregnancy, antepartum; Blood in urine; Uterine leiomyoma; Family history of cancer; and History of postpartum depression, currently pregnant in second trimester on their problem list.  Patient reports abdominal pain right sided.  Contractions: Irritability. Vag. Bleeding: None.  Movement: Present. Denies leaking of fluid.   The following portions of the patient's history were reviewed and updated as appropriate: allergies, current medications, past family history, past medical history, past social history, past surgical history and problem list.   Objective:   Vitals:   11/13/20 1605  BP: 112/73  Pulse: 99  Weight: 236 lb (107 kg)    Fetal Status: Fetal Heart Rate (bpm): 153   Movement: Present     General:  Alert, oriented and cooperative. Patient is in no acute distress.  Skin: Skin is warm and dry. No rash noted.   Cardiovascular: Normal heart rate noted  Respiratory: Normal respiratory effort, no problems with respiration noted  Abdomen: Soft, gravid, appropriate for gestational age.  Pain/Pressure: Present     Pelvic: Cervical exam deferred        Extremities: Normal range of motion.  Edema: None  Mental Status: Normal mood and affect. Normal behavior. Normal judgment and thought content.   Assessment and Plan:  Pregnancy: G3P1011 at [redacted]w[redacted]d 1. Supervision of high risk pregnancy in first trimester Round ligament pain discussed Continue prenatal care.  2. History of postpartum depression, currently pregnant in second trimester Declines medications -  Ambulatory referral to [place] obstetric precautions including but not limited to vaginal bleeding, contractions, leaking of fluid and fetal movement were reviewed in detail with the patient. Please refer to After Visit Summary for other counseling recommendations.   Return in 4 weeks (on 12/11/2020).  Future Appointments  Date Time Provider [place]  11/24/2020  1:00 PM Lynnea Ferrier, LCSW [place] None  12/11/2020  3:30 PM [name], MD [facility]  01/09/2021 10:15 AM [facility] NURSE [facility]  01/09/2021 10:30 AM [facility] US3 [facility]    Donnamae Jude, MD

## 2020-11-24 ENCOUNTER — Ambulatory Visit (INDEPENDENT_AMBULATORY_CARE_PROVIDER_SITE_OTHER): Payer: 59 | Admitting: Licensed Clinical Social Worker

## 2020-11-24 DIAGNOSIS — O99891 Other specified diseases and conditions complicating pregnancy: Secondary | ICD-10-CM

## 2020-11-24 DIAGNOSIS — Z3A00 Weeks of gestation of pregnancy not specified: Secondary | ICD-10-CM | POA: Diagnosis not present

## 2020-11-24 DIAGNOSIS — O99340 Other mental disorders complicating pregnancy, unspecified trimester: Secondary | ICD-10-CM | POA: Diagnosis not present

## 2020-11-24 DIAGNOSIS — F32A Depression, unspecified: Secondary | ICD-10-CM

## 2020-11-26 NOTE — BH Specialist Note (Signed)
Integrated Behavioral Health via Telemedicine Visit  11/26/2020 Paige Davidson [id number]  Number of [place] visits: 1/6 Session Start time: 1:00pm  Session End time: 1:33pm Total time: 33 mins via phone   Referring Provider: Dr. Kennon Rounds  Patient/Family location: Home  [facility] Provider location: [place] All persons participating in visit: Pt Paige Davidson and LCSWA A. Joeann Steppe  Types of Service: Individual psychotherapy and Telephone visit  I connected with Paige Davidson and/or Paige Davidson's n/a via  Telephone or Geologist, engineering  (Video is Caregility application) and verified that I am speaking with the correct person using two identifiers. Discussed confidentiality: Yes   I discussed the limitations of telemedicine and the availability of in person appointments.  Discussed there is a possibility of technology failure and discussed alternative modes of communication if that failure occurs.  I discussed that engaging in this telemedicine visit, they consent to the provision of behavioral healthcare and the services will be billed under their insurance.  Patient and/or legal guardian expressed understanding and consented to Telemedicine visit: Yes   Presenting Concerns: Patient and/or family reports the following symptoms/concerns: depression  Duration of problem: approx 2 years ; Severity of problem: moderate  Patient and/or Family's Strengths/Protective Factors: Social and Emotional competence  Goals Addressed: Patient will: 1.  Reduce symptoms of: depression and stress  2.  Increase knowledge and/or ability of: coping skills and healthy habits  3.  Demonstrate ability to: Increase adequate support systems for patient/family  Progress towards Goals: Ongoing  Interventions: Interventions utilized:  Motivational Interviewing and Supportive Counseling Standardized Assessments completed: Not  Needed   Assessment: Patient currently experiencing depression affecting pregnancy   Patient may benefit from integrated behavioral health   Plan: 1. Follow up with behavioral health clinician on : 2 weeks via mychart  2. Behavioral recommendations: prioritize rest, begin positive self affirmations and create smart goals. Communicate needs with support system and increase social interaction to decrease social isolation.  3. Referral(s): [place] (In Clinic)  I discussed the assessment and treatment plan with the patient and/or parent/guardian. They were provided an opportunity to ask questions and all were answered. They agreed with the plan and demonstrated an understanding of the instructions.   They were advised to call back or seek an in-person evaluation if the symptoms worsen or if the condition fails to improve as anticipated.  Lynnea Ferrier, LCSW

## 2020-12-11 ENCOUNTER — Telehealth (INDEPENDENT_AMBULATORY_CARE_PROVIDER_SITE_OTHER): Payer: 59 | Admitting: Obstetrics and Gynecology

## 2020-12-11 ENCOUNTER — Other Ambulatory Visit: Payer: Self-pay

## 2020-12-11 VITALS — BP 109/89 | HR 81

## 2020-12-11 DIAGNOSIS — Z9889 Other specified postprocedural states: Secondary | ICD-10-CM

## 2020-12-11 DIAGNOSIS — O99212 Obesity complicating pregnancy, second trimester: Secondary | ICD-10-CM

## 2020-12-11 DIAGNOSIS — O3429 Maternal care due to uterine scar from other previous surgery: Secondary | ICD-10-CM

## 2020-12-11 DIAGNOSIS — D259 Leiomyoma of uterus, unspecified: Secondary | ICD-10-CM

## 2020-12-11 DIAGNOSIS — Z3A25 25 weeks gestation of pregnancy: Secondary | ICD-10-CM

## 2020-12-11 DIAGNOSIS — O3410 Maternal care for benign tumor of corpus uteri, unspecified trimester: Secondary | ICD-10-CM

## 2020-12-11 DIAGNOSIS — O99891 Other specified diseases and conditions complicating pregnancy: Secondary | ICD-10-CM

## 2020-12-11 DIAGNOSIS — O0991 Supervision of high risk pregnancy, unspecified, first trimester: Secondary | ICD-10-CM

## 2020-12-11 DIAGNOSIS — E669 Obesity, unspecified: Secondary | ICD-10-CM

## 2020-12-11 DIAGNOSIS — O3412 Maternal care for benign tumor of corpus uteri, second trimester: Secondary | ICD-10-CM

## 2020-12-11 DIAGNOSIS — O99210 Obesity complicating pregnancy, unspecified trimester: Secondary | ICD-10-CM

## 2020-12-11 NOTE — Progress Notes (Signed)
    TELEHEALTH OBSTETRICS VISIT ENCOUNTER NOTE  Provider location: [facility] for [place] at [facility]   Patient location: Home  I connected with Paige Davidson on 12/11/20 at  1:00 PM EDT by telephone at home and verified that I am speaking with the correct person using two identifiers. Of note, unable to do video encounter due to technical difficulties.    I discussed the limitations, risks, security and privacy concerns of performing an evaluation and management service by telephone and the availability of in person appointments. I also discussed with the patient that there may be a patient responsible charge related to this service. The patient expressed understanding and agreed to proceed.  Subjective:  Paige Davidson is a 30 y.o. G3P1011 at [redacted]w[redacted]d being followed for ongoing prenatal care.  She is currently monitored for the following issues for this high-risk pregnancy and has History of ectopic pregnancy; Supervision of high risk pregnancy in first trimester; Uterine fibroid during pregnancy, antepartum; Pelvic adhesive disease; History of myomectomy; Obesity (BMI 30-39.9); Obesity in pregnancy, antepartum; Blood in urine; Uterine leiomyoma; Family history of cancer; and History of postpartum depression, currently pregnant in second trimester on their problem list.  Patient reports Patient reports spotting once with wiping yesterday, no pain or discharge or itching.. Reports fetal movement. Denies any contractions, bleeding or leaking of fluid.   The following portions of the patient's history were reviewed and updated as appropriate: allergies, current medications, past family history, past medical history, past social history, past surgical history and problem list.   Objective:  Blood pressure 109/89, pulse 81, last menstrual period 06/23/2020, unknown if currently breastfeeding. General:  Alert, oriented and cooperative.   Mental Status: Normal mood and affect  perceived. Normal judgment and thought content.  Rest of physical exam deferred due to type of encounter  Assessment and Plan:  Pregnancy: G3P1011 at [redacted]w[redacted]d 1. History of postpartum depression, currently pregnant in second trimester Seen by [facility] a few weeks ago  2. Supervision of high risk pregnancy in second trimester Routine care. 2h GTT nv. Has f/u growth on 6/3. D/w pt more re: birth control nv.  3. Uterine fibroid during pregnancy, antepartum  4. History of myomectomy Request sent for 37wk c/s  5. Obesity in pregnancy, antepartum  Preterm labor symptoms and general obstetric precautions including but not limited to vaginal bleeding, contractions, leaking of fluid and fetal movement were reviewed in detail with the patient.  I discussed the assessment and treatment plan with the patient. The patient was provided an opportunity to ask questions and all were answered. The patient agreed with the plan and demonstrated an understanding of the instructions. The patient was advised to call back or seek an in-person office evaluation/go to [facility] at [facility] for any urgent or concerning symptoms. Please refer to After Visit Summary for other counseling recommendations.   I provided 10 minutes of non-face-to-face time during this encounter.  Return in about 2 weeks (around 12/25/2020) for in person, 2hr GTT, md visit.  Future Appointments  Date Time Provider [place]  01/06/2021 10:00 AM [name], MD [facility]  01/09/2021 10:15 AM [facility] NURSE [facility]  01/09/2021 10:30 AM [facility] US3 [facility] [place], MD [facility] for [place]

## 2020-12-11 NOTE — Progress Notes (Signed)
Pt reports spotting yesterday, denies recent intercourse.

## 2020-12-15 ENCOUNTER — Encounter: Payer: Self-pay | Admitting: *Deleted

## 2020-12-15 ENCOUNTER — Telehealth: Payer: Self-pay | Admitting: *Deleted

## 2020-12-15 NOTE — Telephone Encounter (Signed)
Call to patient. Voice mail has number confirmation. Left message advising procedure scheduled on 02-27-21 as discussed from MD, 0930 time and arrive at 0730. Left message will receive letter in the mail and call from facility. Call back to [phone] if questions.

## 2021-01-06 ENCOUNTER — Ambulatory Visit (INDEPENDENT_AMBULATORY_CARE_PROVIDER_SITE_OTHER): Payer: 59 | Admitting: Obstetrics and Gynecology

## 2021-01-06 ENCOUNTER — Other Ambulatory Visit: Payer: Self-pay

## 2021-01-06 VITALS — BP 107/72 | HR 96 | Wt 239.0 lb

## 2021-01-06 DIAGNOSIS — E669 Obesity, unspecified: Secondary | ICD-10-CM

## 2021-01-06 DIAGNOSIS — D259 Leiomyoma of uterus, unspecified: Secondary | ICD-10-CM

## 2021-01-06 DIAGNOSIS — O0990 Supervision of high risk pregnancy, unspecified, unspecified trimester: Secondary | ICD-10-CM

## 2021-01-06 DIAGNOSIS — Z9889 Other specified postprocedural states: Secondary | ICD-10-CM

## 2021-01-06 DIAGNOSIS — O99891 Other specified diseases and conditions complicating pregnancy: Secondary | ICD-10-CM

## 2021-01-06 DIAGNOSIS — Z8659 Personal history of other mental and behavioral disorders: Secondary | ICD-10-CM

## 2021-01-06 DIAGNOSIS — O99210 Obesity complicating pregnancy, unspecified trimester: Secondary | ICD-10-CM

## 2021-01-06 DIAGNOSIS — B373 Candidiasis of vulva and vagina: Secondary | ICD-10-CM

## 2021-01-06 DIAGNOSIS — Z23 Encounter for immunization: Secondary | ICD-10-CM | POA: Diagnosis not present

## 2021-01-06 DIAGNOSIS — O3663X0 Maternal care for excessive fetal growth, third trimester, not applicable or unspecified: Secondary | ICD-10-CM | POA: Insufficient documentation

## 2021-01-06 DIAGNOSIS — O0991 Supervision of high risk pregnancy, unspecified, first trimester: Secondary | ICD-10-CM

## 2021-01-06 DIAGNOSIS — B3731 Acute candidiasis of vulva and vagina: Secondary | ICD-10-CM

## 2021-01-06 DIAGNOSIS — Z3A29 29 weeks gestation of pregnancy: Secondary | ICD-10-CM

## 2021-01-06 DIAGNOSIS — O3410 Maternal care for benign tumor of corpus uteri, unspecified trimester: Secondary | ICD-10-CM

## 2021-01-06 MED ORDER — MICONAZOLE NITRATE 2 % VA CREA: 1.0000 | TOPICAL_CREAM | Freq: Every day | VAGINAL | 2 refills | Status: DC

## 2021-01-06 NOTE — Progress Notes (Signed)
     PRENATAL VISIT NOTE  Subjective:  Paige Davidson is a 30 y.o. G3P1011 at [redacted]w[redacted]d being seen today for ongoing prenatal care.  She is currently monitored for the following issues for this high-risk pregnancy and has History of ectopic pregnancy; Supervision of high risk pregnancy, antepartum; Uterine fibroid during pregnancy, antepartum; Pelvic adhesive disease; History of myomectomy; Obesity (BMI 30-39.9); Obesity in pregnancy, antepartum; Blood in urine; Uterine leiomyoma; Family history of cancer; History of postpartum depression, currently pregnant in second trimester; and Excessive fetal growth affecting management of mother in third trimester, antepartum on their problem list.  Patient reports vaginal irritation and itching for about a week. helped barrier cream.  Contractions: Not present. Vag. Bleeding: None.  Movement: Present. Denies leaking of fluid.   The following portions of the patient's history were reviewed and updated as appropriate: allergies, current medications, past family history, past medical history, past social history, past surgical history and problem list.   Objective:   Vitals:   01/06/21 0847  BP: 107/72  Pulse: 96  Weight: 239 lb (108.4 kg)    Fetal Status: Fetal Heart Rate (bpm): 141 Fundal Height: 30 cm Movement: Present     General:  Alert, oriented and cooperative. Patient is in no acute distress.  Skin: Skin is warm and dry. No rash noted.   Cardiovascular: Normal heart rate noted  Respiratory: Normal respiratory effort, no problems with respiration noted  Abdomen: Soft, gravid, appropriate for gestational age.  Pain/Pressure: Present     Pelvic: EGBUS with some slight erythema. +white cottage cheese like d/c at the introitus  Extremities: Normal range of motion.  Edema: None  Mental Status: Normal mood and affect. Normal behavior. Normal judgment and thought content.   Assessment and Plan:  Pregnancy: G3P1011 at [redacted]w[redacted]d 1. Supervision of  high risk pregnancy in first trimester Routine care. Has rpt growth later this week - Glucose Tolerance, 2 Hours w/1 Hour - CBC - RPR - HIV Antibody (routine testing w rflx)  2. [redacted] weeks gestation of pregnancy  3. Supervision of high risk pregnancy, antepartum  4. Uterine fibroid during pregnancy, antepartum  5. Obesity in pregnancy, antepartum  6. Obesity (BMI 30-39.9)  7. History of myomectomy Set up for 37wk c/s on 7/22 already  8. Excessive fetal growth affecting management of pregnancy in third trimester, single or unspecified fetus F/u 2h GTT today  9. Vulvovaginal candidiasis otc monistat 7  10. History of postpartum depression, currently pregnant in second trimester Doing well. Seen by [facility] a month ago  Preterm labor symptoms and general obstetric precautions including but not limited to vaginal bleeding, contractions, leaking of fluid and fetal movement were reviewed in detail with the patient. Please refer to After Visit Summary for other counseling recommendations.   Return in about 2 weeks (around 01/20/2021) for in person or virtual, high risk ob, md visit.  Future Appointments  Date Time Provider [place]  01/06/2021 10:00 AM [name], MD [facility]  01/09/2021 10:15 AM [facility] NURSE [facility]  01/09/2021 10:30 AM [facility] US3 [facility]    [name], MD

## 2021-01-07 LAB — GLUCOSE TOLERANCE, 2 HOURS W/ 1HR
Glucose, 1 hour: 142 mg/dL (ref 65–179)
Glucose, 2 hour: 88 mg/dL (ref 65–152)
Glucose, Fasting: 76 mg/dL (ref 65–91)

## 2021-01-07 LAB — CBC
Hematocrit: 34.6 % (ref 34.0–46.6)
Hemoglobin: 11.4 g/dL (ref 11.1–15.9)
MCH: 30.1 pg (ref 26.6–33.0)
MCHC: 32.9 g/dL (ref 31.5–35.7)
MCV: 91 fL (ref 79–97)
Platelets: 269 10*3/uL (ref 150–450)
RBC: 3.79 x10E6/uL (ref 3.77–5.28)
RDW: 12.8 % (ref 11.7–15.4)
WBC: 10.7 10*3/uL (ref 3.4–10.8)

## 2021-01-07 LAB — HIV ANTIBODY (ROUTINE TESTING W REFLEX): HIV Screen 4th Generation wRfx: NONREACTIVE

## 2021-01-07 LAB — RPR: RPR Ser Ql: NONREACTIVE

## 2021-01-09 ENCOUNTER — Other Ambulatory Visit: Payer: Self-pay

## 2021-01-09 ENCOUNTER — Other Ambulatory Visit: Payer: Self-pay | Admitting: *Deleted

## 2021-01-09 ENCOUNTER — Encounter: Payer: Self-pay | Admitting: *Deleted

## 2021-01-09 ENCOUNTER — Ambulatory Visit: Payer: 59 | Admitting: *Deleted

## 2021-01-09 ENCOUNTER — Ambulatory Visit: Payer: 59 | Attending: Maternal & Fetal Medicine

## 2021-01-09 DIAGNOSIS — O3429 Maternal care due to uterine scar from other previous surgery: Secondary | ICD-10-CM | POA: Insufficient documentation

## 2021-01-09 DIAGNOSIS — O99891 Other specified diseases and conditions complicating pregnancy: Secondary | ICD-10-CM

## 2021-01-09 DIAGNOSIS — O99213 Obesity complicating pregnancy, third trimester: Secondary | ICD-10-CM | POA: Diagnosis not present

## 2021-01-09 DIAGNOSIS — O3413 Maternal care for benign tumor of corpus uteri, third trimester: Secondary | ICD-10-CM | POA: Diagnosis not present

## 2021-01-09 DIAGNOSIS — O34219 Maternal care for unspecified type scar from previous cesarean delivery: Secondary | ICD-10-CM | POA: Diagnosis not present

## 2021-01-09 DIAGNOSIS — Z8659 Personal history of other mental and behavioral disorders: Secondary | ICD-10-CM | POA: Diagnosis present

## 2021-01-09 DIAGNOSIS — E669 Obesity, unspecified: Secondary | ICD-10-CM

## 2021-01-09 DIAGNOSIS — Z3A30 30 weeks gestation of pregnancy: Secondary | ICD-10-CM

## 2021-01-09 DIAGNOSIS — D259 Leiomyoma of uterus, unspecified: Secondary | ICD-10-CM

## 2021-01-09 DIAGNOSIS — R638 Other symptoms and signs concerning food and fluid intake: Secondary | ICD-10-CM

## 2021-01-12 ENCOUNTER — Telehealth: Payer: Self-pay | Admitting: Obstetrics and Gynecology

## 2021-01-12 DIAGNOSIS — O26899 Other specified pregnancy related conditions, unspecified trimester: Secondary | ICD-10-CM

## 2021-01-12 NOTE — Telephone Encounter (Signed)
OB Telephone Note Patient called at [phone] b/c was requesting a call back re: her labs and ultrasound.   She didn't see my previous mychart message that everything is normal; she was wondering if everything is okay even though it was all within range but just barely and I told her that it is fine  She also was wondering if it's okay that the baby went from 98% at her anatomy u/s in mid march to 40% on 6/3. I told her that it was and that we will just continue to follow the weight with her subsequent repeat ultrasounds  She also notes pelvic pain at the pubic bone and feels some clicking in her pelvis at times and she is wondering about going out of work for this. I told her that this, fortunately, only uncomfortable and has no impact on the pregnancy but I do recommend PT eval which she is amenable to  Durene Romans MD Attending [facility] [place] (Faculty Practice) 01/12/2021 Time: [phone]

## 2021-01-14 ENCOUNTER — Encounter: Payer: Self-pay | Admitting: Obstetrics and Gynecology

## 2021-01-14 ENCOUNTER — Ambulatory Visit (INDEPENDENT_AMBULATORY_CARE_PROVIDER_SITE_OTHER): Payer: 59 | Admitting: Obstetrics and Gynecology

## 2021-01-14 ENCOUNTER — Other Ambulatory Visit: Payer: Self-pay

## 2021-01-14 VITALS — BP 118/72 | Ht 66.0 in | Wt 240.0 lb

## 2021-01-14 DIAGNOSIS — O3410 Maternal care for benign tumor of corpus uteri, unspecified trimester: Secondary | ICD-10-CM

## 2021-01-14 DIAGNOSIS — O0990 Supervision of high risk pregnancy, unspecified, unspecified trimester: Secondary | ICD-10-CM

## 2021-01-14 DIAGNOSIS — O99891 Other specified diseases and conditions complicating pregnancy: Secondary | ICD-10-CM | POA: Diagnosis not present

## 2021-01-14 DIAGNOSIS — Z8759 Personal history of other complications of pregnancy, childbirth and the puerperium: Secondary | ICD-10-CM | POA: Diagnosis not present

## 2021-01-14 DIAGNOSIS — D259 Leiomyoma of uterus, unspecified: Secondary | ICD-10-CM

## 2021-01-14 DIAGNOSIS — Z8659 Personal history of other mental and behavioral disorders: Secondary | ICD-10-CM

## 2021-01-14 DIAGNOSIS — Z9889 Other specified postprocedural states: Secondary | ICD-10-CM

## 2021-01-14 DIAGNOSIS — O99210 Obesity complicating pregnancy, unspecified trimester: Secondary | ICD-10-CM

## 2021-01-14 NOTE — Progress Notes (Signed)
Obstetrics & Gynecology Office Visit   Chief Complaint:  Chief Complaint  Patient presents with  . Consult    Need second opinion for gtt done at women's clinic. RM 4    History of Present Illness: 30 y.o. G3P1011 at [redacted]w[redacted]d presenting for second opinion regarding her recent growth ultrasound.  Anatomy scan at ~20 week revealed EFW of 12 oz c/w 96%ile.  Her most recent growth scan 01/08/2021 obtained secondary to maternal obesity as well as large 10cm uterine fibroid revealed EFW of 1510g or 3lbs 5oz (40%ile) AC 34%ile, with AFI of 9.13cm.  She is concerned about the drop in the growth percentile.  IN addition she has questions about her 28 week labs and glucose test results from 01/06/2021.     Review of Systems:  Review of Systems  Constitutional: Negative.   Gastrointestinal: Negative.   Genitourinary: Negative.   Psychiatric/Behavioral: Positive for depression.     Past Medical History:  Past Medical History:  Diagnosis Date  . Fibroids   . 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 Pregnancy with history of uterine myomectomy 05/31/2019  . Size > Dates 04/03/2019   [ ]  f/u mfm scan, needs serial growths -9/1: efw 87%, normal ac, afi -9/21:  EFW 65th% AFI WNL  . Status post primary low transverse cesarean section 06/02/2019   Hx myomectomy, tx as classical  . Supervision of high risk pregnancy, antepartum 11/03/2018   Clinic [facility] Prenatal Labs Dating LMP Blood type: --/--/B POS Performed at [facility], [address] [place], [place] [address]  [phone])  Genetic Screen 1 Screen: Negative         Antibody:Negative (03/09 0952) Anatomic [place] Complete 7/6 Rubella: 3.01 (03/09 6269) Varicella: Immune GTT Early:               Third trimester: wnl RPR: Non Reactive (03/09 0952)  Rhogam N/A HBsAg:     Past Surgical History:  Past Surgical History:  Procedure Laterality Date  . CESAREAN SECTION N/A 05/31/2019   Procedure: CESAREAN SECTION;  Surgeon:  Mora Bellman, MD;  Location: [facility];  Service: Obstetrics;  Laterality: N/A;  . CYSTECTOMY    . MYOMECTOMY  12/2018   [place] Dr. [name]. Sounds robotic assisted. pt states was told okay to labor  . REMOVAL OF IMPLANON ROD  01/31/2020  . surgery on left ear     bone removal    Gynecologic History: Patient's last menstrual period was 06/23/2020 (exact date).  Obstetric History: G3P1011  Family History:  Family History  Problem Relation Age of Onset  . Breast cancer Maternal Grandmother   . Diabetes Maternal Grandfather   . Hypertension Paternal Grandfather   . Breast cancer Paternal Aunt   . Breast cancer Paternal Aunt     Social History:  Social History   Socioeconomic History  . Marital status: Married    Spouse name: Not on file  . Number of children: Not on file  . Years of education: Not on file  . Highest education level: Not on file  Occupational History  . Not on file  Tobacco Use  . Smoking status: Never Smoker  . Smokeless tobacco: Never Used  Vaping Use  . Vaping Use: Former  . Substances: Flavoring  Substance and Sexual Activity  . Alcohol use: Not Currently    Comment: occasionally   . Drug use: No  . Sexual activity: Yes    Birth  control/protection: None  Other Topics Concern  . Not on file  Social History Narrative  . Not on file   Social Determinants of Health   Financial Resource Strain: Not on file  Food Insecurity: Not on file  Transportation Needs: Not on file  Physical Activity: Not on file  Stress: Not on file  Social Connections: Not on file  Intimate Partner Violence: Not on file    Allergies:  Allergies  Allergen Reactions  . Penicillins Hives    Did it involve swelling of the face/tongue/throat, SOB, or low BP? No Did it involve sudden or severe rash/hives, skin peeling, or any reaction on the inside of your mouth or nose? Yes Did you need to seek medical attention at a hospital or doctor's office?  Unknown When did it last happen?5+ years If all above answers are "NO", may proceed with cephalosporin use.     Medications: Prior to Admission medications   Medication Sig Start Date End Date Taking? Authorizing Provider  Prenat w/o A Vit-FeFum-FePo-FA (CONCEPT OB) 130-92.4-1 MG CAPS Take 1 capsule by mouth daily. 12/12/18  Yes [provider]  miconazole (MONISTAT 7) 2 % vaginal cream Place 1 Applicatorful vaginally at bedtime. Apply for seven nights Patient not taking: Reported on 01/09/2021 01/06/21   [name], MD    Physical Exam Vitals: Blood pressure 118/72, height 5\' 6"  (1.676 m), weight 240 lb (108.9 kg), last menstrual period 06/23/2020, unknown if currently breastfeeding. Patient's last menstrual period was 06/23/2020 (exact date).    General: NAD, well nourished, appears stated age 45: normocephalic, anicteric Pulmonary: No increased work of breathing Extremities: no edema, erythema, or tenderness Neurologic: Grossly intact Psychiatric: mood appropriate, affect full  [place] MFM OB FOLLOW UP  Result Date: 01/09/2021 ----------------------------------------------------------------------  OBSTETRICS REPORT                       (Signed Final 01/09/2021 11:34 am) ---------------------------------------------------------------------- Patient Info  ID #:       [id number]                          D.O.B.:  06/17/1991 (30 yrs)  Name:       Paige Davidson              Visit Date: 01/09/2021 10:34 am ---------------------------------------------------------------------- Performed By  Attending:        Johnell Comings MD         Ref. Address:     [place], [place]  [address]  Performed By:     Germain Osgood            [facility]                                                             [facility]                                                             [facility]  Referred By:      [name] MD ---------------------------------------------------------------------- Orders  #  Description                           Code        Ordered By  1  [place] MFM OB FOLLOW UP                   92119.41    Sander Nephew ----------------------------------------------------------------------  #  Order #                     Accession #                Episode #  1  [id number]                   [id number]                 [id number] ---------------------------------------------------------------------- Indications  Uterine fibroids affecting pregnancy in third  O34.13, D25.9  trimester, antepartum  Obesity complicating pregnancy, third          O99.213  trimester  History of cesarean delivery, currently        O37.219  pregnant  Hx Myomectomy  Hx of ectopic pregnancy  Declined Gen. Testing. AFP - neg.  [redacted] weeks gestation of pregnancy                Z3A.30 ---------------------------------------------------------------------- Fetal Evaluation  Num Of Fetuses:         1  Fetal Heart Rate(bpm):  150  Cardiac Activity:       Observed  Presentation:           Cephalic  Placenta:  Anterior Fundal  P. Cord Insertion:      Visualized, central  Amniotic Fluid  AFI FV:      Within normal limits  AFI Sum(cm)     %Tile       Largest Pocket(cm)  9.13            6           3.66  RUQ(cm)       RLQ(cm)       LUQ(cm)        LLQ(cm)  2.33          3.66          1.15           1.99 ---------------------------------------------------------------------- Biometry  BPD:      82.6  mm     G. Age:  33w 2d         99  %    CI:        82.49   %    70 - 86                                                          FL/HC:      19.7   %    19.2 - 21.4  HC:       286.9  mm     G. Age:  31w 4d         60  %    HC/AC:      1.13        0.99 - 1.21  AC:      254.6  mm     G. Age:  29w 5d         34  %    FL/BPD:     68.4   %    71 - 87  FL:       56.5  mm     G. Age:  29w 5d         26  %    FL/AC:      22.2   %    20 - 24  HUM:      53.2  mm     G. Age:  31w 0d         70  %  LV:        3.2  mm  Est. FW:    1510  gm      3 lb 5 oz     40  % ---------------------------------------------------------------------- OB History  Gravidity:    3         Term:   1  Ectopic:      1 ---------------------------------------------------------------------- Gestational Age  LMP:           28w 4d        Date:  06/23/20                 EDD:   03/30/21  U/S Today:     31w 1d                                        EDD:   03/12/21  Best:  30w 0d     Det. ByLoman Chroman         EDD:   03/20/21                                      (09/01/20) ---------------------------------------------------------------------- Anatomy  Cranium:               Appears normal         LVOT:                   Previously seen  Cavum:                 Previously seen        Aortic Arch:            Previously seen  Ventricles:            Appears normal         Ductal Arch:            Previously seen  Choroid Plexus:        Previously seen        Diaphragm:              Appears normal  Cerebellum:            Previously seen        Stomach:                Appears normal, left                                                                        sided  Posterior Fossa:       Previously seen        Abdomen:                Appears normal  Nuchal Fold:           Previously seen        Abdominal Wall:         Previously seen  Face:                  Orbits and profile     Cord Vessels:           Previously seen                         previously seen  Lips:                  Previously seen        Kidneys:                Appear normal  Palate:                Not well visualized    Bladder:                Appears  normal  Thoracic:              Previously seen        Spine:  Previously seen  Heart:                 Previously seen        Upper Extremities:      Previously seen  RVOT:                  Previously seen        Lower Extremities:      Previously seen  Other:  Fetus appears to be female. Nasal bone prev visualized. Heels/feet          and open hands/5th digits prev visualized. VC, 3VV and 3VTV prev          visualized. Technically difficult due to maternal habitus. ---------------------------------------------------------------------- Cervix Uterus Adnexa  Cervix  Not visualized (advanced GA >24wks)  Uterus  Single fibroid noted, see table below.  Right Ovary  Not visualized.  Left Ovary  Not visualized.  Cul De Sac  No free fluid seen.  Adnexa  No adnexal mass visualized.  Comment  Fibroid in left adnexa not well visualized. ---------------------------------------------------------------------- Myomas  Site                     L(cm)      W(cm)      D(cm)       Location  Left                     9.79       5.02       4.17 ----------------------------------------------------------------------  Blood Flow                  RI       PI       Comments ---------------------------------------------------------------------- Comments  This patient was seen for a follow up growth scan due to due  to a fibroid uterus.  She denies any problems since her last  exam.  She was informed that the fetal growth and amniotic fluid  level appears appropriate for her gestational age.  The 10 cm left sided fibroid continues to be noted on today's  exam.  Due to her fibroid uterus, a follow-up growth scan was  scheduled in 4 weeks. ----------------------------------------------------------------------                   Johnell Comings, MD Electronically Signed Final Report   01/09/2021 11:34 am ----------------------------------------------------------------------   Assessment: 30 y.o. G3P1011 presenting for follow up  regarding   Plan: Problem List Items Addressed This Visit      Genitourinary   Uterine fibroid during pregnancy, antepartum - Primary     Other   History of ectopic pregnancy   Supervision of high risk pregnancy, antepartum   History of myomectomy   Obesity in pregnancy, antepartum   History of postpartum depression, currently pregnant in second trimester      Gestational age appropriate obstetric precautions including but not limited to vaginal bleeding, contractions, leaking of fluid and fetal movement were reviewed in detail with the patient.    1) Growth - 1510g or 3lbs 5oz (40%ile) AC 34%ile, with AFI of 9.13cm.  We discussed that these are appropriate growth measurement at this gestational age.  We discussed that her anatomy scan growth percentile at 20 week of 96% has some limitation and that the difference of 1-2 ounces may cause a difference in the growth percentile of up to 15%-20%.  Discussed importance of serial growth given the limitation of fundal height in  her case with given her 10cm uterine fibroid.   - Discussed that Dr. Ilda Basset' care has been excellent and thorough.  I would concur with all recommendation that have been made to date   2) Gestational diabetes - normal 4/4 values on her recent 3-hr result.  Given copy of [facility]   3) A total of 25 minutes were spent in face-to-face contact with the patient during this encounter with over half of that time devoted to counseling and coordination of care.  4) Return if symptoms worsen or fail to improve.  Malachy Mood, MD, [facility], [facility] 01/14/2021, 10:18 AM

## 2021-01-18 ENCOUNTER — Other Ambulatory Visit: Payer: Self-pay

## 2021-01-19 ENCOUNTER — Ambulatory Visit (INDEPENDENT_AMBULATORY_CARE_PROVIDER_SITE_OTHER): Payer: 59 | Admitting: Obstetrics & Gynecology

## 2021-01-19 ENCOUNTER — Other Ambulatory Visit: Payer: Self-pay

## 2021-01-19 ENCOUNTER — Encounter: Payer: Self-pay | Admitting: Obstetrics & Gynecology

## 2021-01-19 VITALS — BP 118/77 | HR 96 | Wt 239.0 lb

## 2021-01-19 DIAGNOSIS — Z3A31 31 weeks gestation of pregnancy: Secondary | ICD-10-CM

## 2021-01-19 DIAGNOSIS — Z9889 Other specified postprocedural states: Secondary | ICD-10-CM

## 2021-01-19 DIAGNOSIS — R102 Pelvic and perineal pain: Secondary | ICD-10-CM

## 2021-01-19 DIAGNOSIS — O0990 Supervision of high risk pregnancy, unspecified, unspecified trimester: Secondary | ICD-10-CM

## 2021-01-19 DIAGNOSIS — O26899 Other specified pregnancy related conditions, unspecified trimester: Secondary | ICD-10-CM

## 2021-01-19 MED ORDER — CYCLOBENZAPRINE HCL 10 MG PO TABS: 10.0000 mg | ORAL_TABLET | Freq: Three times a day (TID) | ORAL | 2 refills | Status: DC | PRN

## 2021-01-19 NOTE — Progress Notes (Signed)
   PRENATAL VISIT NOTE  Subjective:  Paige Davidson is a 30 y.o. G3P1011 at [redacted]w[redacted]d being seen today for ongoing prenatal care.  She is currently monitored for the following issues for this high-risk pregnancy and has Supervision of high risk pregnancy, antepartum; Uterine fibroid during pregnancy, antepartum; Pelvic adhesive disease; History of myomectomy; Obesity in pregnancy, antepartum; and History of postpartum depression, currently pregnant in second trimester on their problem list.  Patient reports  continued pelvic pain .  Contractions: Not present. Vag. Bleeding: None.  Movement: Present. Denies leaking of fluid.   The following portions of the patient's history were reviewed and updated as appropriate: allergies, current medications, past family history, past medical history, past social history, past surgical history and problem list.   Objective:   Vitals:   01/19/21 1312  BP: 118/77  Pulse: 96  Weight: 239 lb (108.4 kg)    Fetal Status: Fetal Heart Rate (bpm): 157   Movement: Present     General:  Alert, oriented and cooperative. Patient is in no acute distress.  Skin: Skin is warm and dry. No rash noted.   Cardiovascular: Normal heart rate noted  Respiratory: Normal respiratory effort, no problems with respiration noted  Abdomen: Soft, gravid, appropriate for gestational age.  Pain/Pressure: Present     Pelvic: Cervical exam deferred        Extremities: Normal range of motion.  Edema: None  Mental Status: Normal mood and affect. Normal behavior. Normal judgment and thought content.   Assessment and Plan:  Pregnancy: G3P1011 at [redacted]w[redacted]d 1. Pelvic pain in pregnancy Discussed pelvic support belts, acetaminophen, Flexeril. Already referred to PT, awaiting their response. Patient is considering stopping work, she was told this would be her decision as it will affect her FMLA time. - cyclobenzaprine (FLEXERIL) 10 MG tablet; Take 1 tablet (10 mg total) by mouth 3 (three)  times daily as needed for muscle spasms.  Dispense: 30 tablet; Refill: 2  2. History of myomectomy Already scheduled for cesarean section at 37 weeks  3. [redacted] weeks gestation of pregnancy 4. Supervision of high risk pregnancy, antepartum Had EFW 96%, but was 40% last check. Repeat scan scheduled 02/06/2021, will follow up results and manage accordingly.  Preterm labor symptoms and general obstetric precautions including but not limited to vaginal bleeding, contractions, leaking of fluid and fetal movement were reviewed in detail with the patient. Please refer to After Visit Summary for other counseling recommendations.   Return in about 2 weeks (around 02/02/2021) for OFFICE OB VISIT (MD only).  Future Appointments  Date Time Provider [place]  01/26/2021 10:00 AM Geoffry Paradise L, PT [facility] None  02/02/2021 10:00 AM Elza Rafter, PT [facility] None  02/02/2021  3:00 PM [name], MD [facility]  02/06/2021 10:30 AM [facility] NURSE [facility]  02/06/2021 10:45 AM [facility] US5 [facility]  02/16/2021 11:00 AM Elza Rafter, PT [facility] None  02/23/2021  3:00 PM Colie Fugitt, Sallyanne Havers, MD [facility]    Verita Schneiders, MD

## 2021-01-19 NOTE — Patient Instructions (Signed)
Return to office for any scheduled appointments. Call the office or go to the MAU at [facility] if:  You begin to have strong, frequent contractions  Your water breaks.  Sometimes it is a big gush of fluid, sometimes it is just a trickle that keeps getting your panties wet or running down your legs  You have vaginal bleeding.  It is normal to have a small amount of spotting if your cervix was checked.   You do not feel your baby moving like normal.  If you do not, get something to eat and drink and lay down and focus on feeling your baby move.   If your baby is still not moving like normal, you should call the office or go to MAU.  Any other obstetric concerns.   

## 2021-01-26 ENCOUNTER — Other Ambulatory Visit: Payer: Self-pay

## 2021-01-26 ENCOUNTER — Ambulatory Visit: Payer: 59 | Attending: Obstetrics and Gynecology

## 2021-01-26 DIAGNOSIS — R278 Other lack of coordination: Secondary | ICD-10-CM | POA: Diagnosis not present

## 2021-01-26 DIAGNOSIS — M25551 Pain in right hip: Secondary | ICD-10-CM | POA: Diagnosis present

## 2021-01-26 DIAGNOSIS — M6281 Muscle weakness (generalized): Secondary | ICD-10-CM | POA: Diagnosis present

## 2021-01-26 NOTE — Therapy (Signed)
[place] MAIN [facility] SERVICES [place], [place], [address] Phone: [phone]   Fax:  [phone]  Physical Therapy Evaluation  Patient Details  Name: Paige Davidson MRN: [id number] Date of Birth: November 03, 1990 Referring Provider (PT): [name], MD   Encounter Date: 01/26/2021   PT End of Session - 01/26/21 1251     Visit Number 1    Number of Visits 5    Date for PT Re-Evaluation 03/27/21    Authorization Type [employer]- 66 visits per year    Authorization - Visit Number 1    Authorization - Number of Visits 60    PT Start Time 1008   pt arrived late   PT Stop Time 1059    PT Time Calculation (min) 51 min    Activity Tolerance Patient tolerated treatment well;No increased pain    Behavior During Therapy [facility] for tasks assessed/performed             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    History of ectopic pregnancy 10/16/2018   Obesity (BMI 30-39.9) 09/01/2020   Pregnancy with history of uterine myomectomy 05/31/2019   Size > Dates 04/03/2019   [ ]  f/u mfm scan, needs serial growths -9/1: efw 87%, normal ac, afi -9/21:  EFW 65th% AFI WNL   Status post primary low transverse cesarean section 06/02/2019   Hx myomectomy, tx as classical   Supervision of high risk pregnancy, antepartum 11/03/2018   Clinic [facility] Prenatal Labs Dating LMP Blood type: --/--/B POS Performed at [facility], [address] [place], [place] [address]  [phone])  Genetic Screen 1 Screen: Negative         Antibody:Negative (03/09 0952) Anatomic [place] Complete 7/6 Rubella: 3.01 (03/09 6063) Varicella: Immune GTT Early:               Third trimester: wnl RPR: Non Reactive (03/09 0952)  Rhogam N/A HBsAg:    Uterine leiomyoma 12/10/2013    Past Surgical History:  Procedure Laterality Date   CESAREAN SECTION N/A 05/31/2019    Procedure: CESAREAN SECTION;  Surgeon: Mora Bellman, MD;  Location: [facility] LD ORS;  Service: Obstetrics;  Laterality: N/A;   CYSTECTOMY     MYOMECTOMY  12/2018   [place] Dr. [name]. Sounds robotic assisted. pt states was told okay to labor   REMOVAL OF IMPLANON ROD  01/31/2020   surgery on left ear     bone removal    There were no vitals filed for this visit.    Subjective Assessment - 01/26/21 1018     Subjective Pt reported she's pregnant for the second in two years and has experienced pubic bone popping this time. Pt has difficulty rolling and performing sit<>stand. Pt has chronic yeast infection this pregnancy. At worst pelvic pain is 9/10 during rolling, 0/10 at best. Pt denied falls or MVAs. Pt also experiences RLE pain after standing for approx. 10-15 minutes. Urinary: no leakage, pt voids once in 2 hour period. Bowel: no leakage, bowel movement once every other day and reports straining about 50% of the time (during pregnancy only). Sexual function: not sexually active 2/2 partner being nervous about high risk pregnancy. 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 BP  has been running a bit low this pregnancy.    Pertinent History Uterine fibroids, high risk pregnancy, pelvic adhesive disease, hx of postpartum depression    How long can you sit comfortably? 3 hours    How long can you stand comfortably? 10-15 minutes    How long can you walk comfortably? maybe 20 minutes (hasn't done that in awhile)    Patient Stated Goals I would like to be able to reduce pain, strengthen muscles to not injure herself during recovery.    Currently in Pain? No/denies                [facility] PT Assessment - 01/26/21 1028       Assessment   Medical Diagnosis Pelvic pain in pregnancy    Referring Provider (PT) [name], MD    Onset Date/Surgical Date 12/07/20    Hand  Dominance Right    Prior Therapy none      Precautions   Precautions Other (comment)    Precaution Comments high risk pregnancy      Restrictions   Weight Bearing Restrictions No      Balance Screen   Has the patient fallen in the past 6 months No    Has the patient had a decrease in activity level because of a fear of falling?  No    Is the patient reluctant to leave their home because of a fear of falling?  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 no Unexplained weight loss? no Saddle anesthesia? no Unexplained changes in bowel or bladder changes? No     OBJECTIVE  Posture/Observations:  Sitting: slouched but does not cross LEs Standing: wide BOS and ant. Pelvic tilt and incr. Lx spine lordosis  Palpation/Segmental Motion/Joint Play:     Range of Motion/Flexibilty:  Spine: WNL Hips: WNL except for limited R hip abd and IR 2/2 pain.    Strength/MMT:  LE MMT  B knee ext: 4/5 B knee flex: 3+/5 LE MMT Left Right  Hip flex:  (L2) 4/5 4/5  Hip ext: /5 /5  Hip abd: 3+/5 2/5-unable to test 2/2 6-7/10 pubic bone pain  Hip add: /5 /5  Hip IR /5 /5  Hip ER /5 /5     Abdominal:  Palpation:  not performed  Diastasis: likely present 2/2 pt reporting coning with supine to sit  Pelvic Floor External Exam: Introitus Appears:  Skin integrity:  Palpation: Cough: Prolapse visible?: Scar mobility: Through clothing: compensatory strategy noted with glutes and adductors. Ischial tuberosities: no TTP Palpation for pelvic  floor contraction: see above Coccyx: no TTP    Gait Analysis: pt wearing sandals without straps, wide BOS incr. Lateral movement  INTERVENTIONS THIS SESSION:             Objective measurements completed on examination: See above findings.     Pelvic Floor Special Questions - 01/26/21 1035     Are you Pregnant or attempting pregnancy? Yes    Prior Pregnancies Yes    Number of Pregnancies 3    Number of C-Sections 1    Number of Vaginal Deliveries 0    Any difficulty with labor and deliveries No    Currently Sexually Active No    History of sexually transmitted disease Yes    Urinary Leakage No    Fecal incontinence No    Fluid intake Water all day (approx. 4-5 bottles of 26 oz.)    Caffeine beverages none    Falling out feeling (prolapse) No                    SELF CARE:  PT Education - 01/26/21 1102     Education Details PT educated pt on exam findings, POC, frequency and duration. Provided pt education on proper shoes (support and heel strap) based on kinetic chain. PT demostrated pregnancy belts and supportive pregnancy tank tops to reduce pubic bone shearing. Educated on pelvic floor contractions and the importance posture/alignment plays in ability to effectively contraction PF musculature.    Person(s) Educated Patient    Methods Explanation;Handout    Comprehension Verbalized understanding              PT Short Term Goals - 01/26/21 1259       PT SHORT TERM GOAL #1   Title STGs = LTGs               PT Long Term Goals - 01/26/21 1259       PT LONG TERM GOAL #1   Title Pt will be IND in HEP to  improve pain, strength, and posture. TARGET DATE FOR ALL LTGS: 02/23/21    Time 4    Period Weeks    Status New      PT LONG TERM GOAL #2   Title Have pt complete FOTO survery and write goal prn.    Time 4    Period Weeks    Status New      PT LONG TERM GOAL #3   Title Pt will report pelvic pain </= 3/10 during all ADLs to improve QOL and ability to perform work.    Baseline 9/10 at worst    Time 4    Period Weeks    Status New      PT LONG TERM GOAL #4   Title Pt will be able to properly donn and doff pregnancy belt to wear throughout the day to decr. pain.    Time 4    Period Weeks    Status New                    Plan - 01/26/21 1253     Clinical Impression Statement Pt is a pleasant 30y/o femal presenting to pelvic health OPPT for pelvic pain during pregnancy. Pt is scheduled for c-section on 02/27/21. Pt's PMH is significant for the following: Uterine fibroids, high risk pregnancy, pelvic adhesive disease, hx of postpartum depression. Pt noted to experience incr. pubic symphsis pain during R hip abd MMT (unable to obtain full ROM  2/2 pain), pain also incr. with rolling during sidelying MMT. Pt noted to experience incr. lumbar spine lordosis in standing and wide BOS with incr. ant. pelvic tilt. The following impairments were noted upon exam: gait deviations, pain, impaired strength, impaired hip ROM, postural dysfunction. Pt would benefit from skilled PT to improve pain and safety during all ADLs.    Personal Factors and Comorbidities Comorbidity 3+;Fitness;Profession    Comorbidities Uterine fibroids, high risk pregnancy, pelvic adhesive disease, hx of postpartum depression    Examination-Activity Limitations Bed Mobility;Bend;Locomotion Level;Caring for Others;Carry;Sleep;Sit;Squat;Stand;Transfers;Lift    Examination-Participation Restrictions Laundry;Cleaning;Occupation;Interpersonal Relationship    Stability/Clinical Decision Making Evolving/Moderate complexity     Clinical Decision Making Moderate    Rehab Potential Good    PT Frequency 1x / week    PT Duration --   5 weeks until 02/27/21   PT Treatment/Interventions ADLs/Self Care Home Management;Biofeedback;Functional mobility training;Stair training;Gait training;Therapeutic activities;Therapeutic exercise;Balance training;Neuromuscular re-education;Patient/family education;Manual techniques;Dry needling    PT Next Visit Plan Pelvic floor contraction coordination, pelvic clocks, TrA activation, R hip ROM-MT    Consulted and Agree with Plan of Care Patient             Patient will benefit from skilled therapeutic intervention in order to improve the following deficits and impairments:  Abnormal gait, Decreased balance, Decreased endurance, Decreased mobility, Pain, Postural dysfunction, Decreased strength, Decreased coordination, Decreased range of motion  Visit Diagnosis: Other lack of coordination - Plan: PT plan of care cert/re-cert  Muscle weakness (generalized) - Plan: PT plan of care cert/re-cert  Pain in right hip - Plan: PT plan of care cert/re-cert     Problem List Patient Active Problem List   Diagnosis Date Noted   History of postpartum depression, currently pregnant in second trimester 10/16/2020   Obesity in pregnancy, antepartum 09/01/2020   History of myomectomy 04/30/2019   Pelvic adhesive disease 04/26/2019   Supervision of high risk pregnancy, antepartum 11/03/2018   Uterine fibroid during pregnancy, antepartum 11/03/2018    Jessy Cybulski L 01/26/2021, 1:04 PM  [place], [place], [address] Phone: [phone]   Fax:  [phone]  Name: Paige Davidson MRN: [id number] Date of Birth: Aug 19, 1990  Geoffry Paradise, PT,DPT 01/26/21 1:05 PM Phone: [phone] Fax: [phone]

## 2021-01-28 ENCOUNTER — Encounter: Payer: Self-pay | Admitting: *Deleted

## 2021-02-02 ENCOUNTER — Other Ambulatory Visit: Payer: Self-pay

## 2021-02-02 ENCOUNTER — Ambulatory Visit: Payer: 59

## 2021-02-02 ENCOUNTER — Other Ambulatory Visit (HOSPITAL_COMMUNITY)
Admission: RE | Admit: 2021-02-02 | Discharge: 2021-02-02 | Disposition: A | Discharge status: Home / Self Care | Payer: 59 | Source: Ambulatory Visit | Attending: Obstetrics and Gynecology | Admitting: Obstetrics and Gynecology

## 2021-02-02 ENCOUNTER — Ambulatory Visit (INDEPENDENT_AMBULATORY_CARE_PROVIDER_SITE_OTHER): Payer: 59 | Admitting: Obstetrics and Gynecology

## 2021-02-02 VITALS — BP 110/74 | HR 91 | Wt 244.8 lb

## 2021-02-02 DIAGNOSIS — N736 Female pelvic peritoneal adhesions (postinfective): Secondary | ICD-10-CM

## 2021-02-02 DIAGNOSIS — O3410 Maternal care for benign tumor of corpus uteri, unspecified trimester: Secondary | ICD-10-CM

## 2021-02-02 DIAGNOSIS — B9689 Other specified bacterial agents as the cause of diseases classified elsewhere: Secondary | ICD-10-CM

## 2021-02-02 DIAGNOSIS — O0990 Supervision of high risk pregnancy, unspecified, unspecified trimester: Secondary | ICD-10-CM | POA: Insufficient documentation

## 2021-02-02 DIAGNOSIS — O99210 Obesity complicating pregnancy, unspecified trimester: Secondary | ICD-10-CM

## 2021-02-02 DIAGNOSIS — Z6841 Body Mass Index (BMI) 40.0 and over, adult: Secondary | ICD-10-CM | POA: Insufficient documentation

## 2021-02-02 DIAGNOSIS — N898 Other specified noninflammatory disorders of vagina: Secondary | ICD-10-CM

## 2021-02-02 DIAGNOSIS — D259 Leiomyoma of uterus, unspecified: Secondary | ICD-10-CM

## 2021-02-02 DIAGNOSIS — N760 Acute vaginitis: Secondary | ICD-10-CM

## 2021-02-02 DIAGNOSIS — Z9889 Other specified postprocedural states: Secondary | ICD-10-CM

## 2021-02-02 DIAGNOSIS — Z3A33 33 weeks gestation of pregnancy: Secondary | ICD-10-CM

## 2021-02-02 MED ORDER — METRONIDAZOLE 500 MG PO TABS: 500.0000 mg | ORAL_TABLET | Freq: Two times a day (BID) | ORAL | 0 refills | Status: AC

## 2021-02-02 NOTE — Progress Notes (Signed)
   PRENATAL VISIT NOTE  Subjective:  Paige Davidson is a 30 y.o. G3P1011 at [redacted]w[redacted]d being seen today for ongoing prenatal care.  She is currently monitored for the following issues for this high-risk pregnancy and has Supervision of high risk pregnancy, antepartum; Uterine fibroid during pregnancy, antepartum; Pelvic adhesive disease; History of myomectomy; Obesity in pregnancy, antepartum; History of postpartum depression, currently pregnant in second trimester; and BMI 40.0-44.9, adult ([place]) on their problem list.  Patient reports  white d/c with smell. Tried otc monistat but no change .  Contractions: Not present. Vag. Bleeding: None.  Movement: Present. Denies leaking of fluid.   The following portions of the patient's history were reviewed and updated as appropriate: allergies, current medications, past family history, past medical history, past social history, past surgical history and problem list.   Objective:   Vitals:   02/02/21 1500  BP: 110/74  Pulse: 91  Weight: 244 lb 12.8 oz (111 kg)    Fetal Status: Fetal Heart Rate (bpm): 152   Movement: Present     General:  Alert, oriented and cooperative. Patient is in no acute distress.  Skin: Skin is warm and dry. No rash noted.   Cardiovascular: Normal heart rate noted  Respiratory: Normal respiratory effort, no problems with respiration noted  Abdomen: Soft, gravid, appropriate for gestational age.  Pain/Pressure: Present     Pelvic: Cervical exam performed in the presence of a chaperone       White-yellow d/c. Visually closed and long  Extremities: Normal range of motion.     Mental Status: Normal mood and affect. Normal behavior. Normal judgment and thought content.   Assessment and Plan:  Pregnancy: G3P1011 at [redacted]w[redacted]d 1. [redacted] weeks gestation of pregnancy  2. Supervision of high risk pregnancy, antepartum - Strep Gp B NAA - Cervicovaginal ancillary only( [place])  3. History of myomectomy For rpt c/s on  7/22  4. Obesity in pregnancy, antepartum  5. BMI 40.0-44.9, adult ([facility])  6. Pelvic adhesive disease  7. Uterine fibroid during pregnancy, antepartum Has rpt u/s on 7/10; 6/3 u/s showed afi 9, efw 40%, 1510g, ac 34%, 9-10cm left sided noted (11cm at anatomy u/s)  8. Vaginal discharge Flagyl sent in - Strep Gp B NAA - Cervicovaginal ancillary only( [place])  9. BV (bacterial vaginosis)  Preterm labor symptoms and general obstetric precautions including but not limited to vaginal bleeding, contractions, leaking of fluid and fetal movement were reviewed in detail with the patient. Please refer to After Visit Summary for other counseling recommendations.   No follow-ups on file.  Future Appointments  Date Time Provider [place]  02/06/2021 10:30 AM [facility] NURSE [facility]  02/06/2021 10:45 AM [facility] US5 [facility]  02/16/2021 11:00 AM Elza Rafter, PT [facility] None  02/23/2021  3:00 PM Anyanwu, Sallyanne Havers, MD [facility]    [name], MD

## 2021-02-02 NOTE — Progress Notes (Signed)
Pt c/o white discharge and vaginal odor

## 2021-02-04 LAB — STREP GP B NAA: Strep Gp B NAA: NEGATIVE

## 2021-02-04 LAB — CERVICOVAGINAL ANCILLARY ONLY
Chlamydia: NEGATIVE
Comment: NEGATIVE
Comment: NORMAL
Neisseria Gonorrhea: NEGATIVE

## 2021-02-06 ENCOUNTER — Ambulatory Visit: Payer: Self-pay

## 2021-02-06 ENCOUNTER — Ambulatory Visit: Payer: 59 | Attending: Obstetrics and Gynecology

## 2021-02-13 NOTE — Patient Instructions (Signed)
Paige Davidson  02/13/2021   Your procedure is scheduled on:  02/27/2021  Arrive at 60 at Entrance C on [address] at [facility]  and [address]. You are invited to use the FREE valet parking or use the Visitor's parking deck.  Pick up the phone at the desk and dial [phone].  Call this number if you have problems the morning of surgery: [phone]  Remember:   Do not eat food:(After Midnight) Desps de medianoche.  Do not drink clear liquids: (After Midnight) Desps de medianoche.  Take these medicines the morning of surgery with A SIP OF WATER:  none   Do not wear jewelry, make-up or nail polish.  Do not wear lotions, powders, or perfumes. Do not wear deodorant.  Do not shave 48 hours prior to surgery.  Do not bring valuables to the hospital.  [facility] is not   responsible for any belongings or valuables brought to the hospital.  Contacts, dentures or bridgework may not be worn into surgery.  Leave suitcase in the car. After surgery it may be brought to your room.  For patients admitted to the hospital, checkout time is 11:00 AM the day of              discharge.      Please read over the following fact sheets that you were given:     Preparing for Surgery

## 2021-02-13 NOTE — Pre-Procedure Instructions (Signed)
Phone message left

## 2021-02-16 ENCOUNTER — Ambulatory Visit: Payer: 59

## 2021-02-16 ENCOUNTER — Encounter: Payer: Self-pay | Admitting: Obstetrics and Gynecology

## 2021-02-16 ENCOUNTER — Other Ambulatory Visit: Payer: Self-pay

## 2021-02-16 ENCOUNTER — Inpatient Hospital Stay
Admission: EM | Admit: 2021-02-16 | Discharge: 2021-02-16 | Disposition: A | Discharge status: Home / Self Care | Payer: 59 | Attending: Obstetrics and Gynecology | Admitting: Obstetrics and Gynecology

## 2021-02-16 ENCOUNTER — Telehealth (HOSPITAL_COMMUNITY): Payer: Self-pay | Admitting: *Deleted

## 2021-02-16 DIAGNOSIS — O26893 Other specified pregnancy related conditions, third trimester: Secondary | ICD-10-CM | POA: Insufficient documentation

## 2021-02-16 DIAGNOSIS — Z8659 Personal history of other mental and behavioral disorders: Secondary | ICD-10-CM

## 2021-02-16 DIAGNOSIS — O99891 Other specified diseases and conditions complicating pregnancy: Secondary | ICD-10-CM | POA: Diagnosis not present

## 2021-02-16 DIAGNOSIS — E669 Obesity, unspecified: Secondary | ICD-10-CM | POA: Diagnosis not present

## 2021-02-16 DIAGNOSIS — O09293 Supervision of pregnancy with other poor reproductive or obstetric history, third trimester: Secondary | ICD-10-CM | POA: Insufficient documentation

## 2021-02-16 DIAGNOSIS — R42 Dizziness and giddiness: Secondary | ICD-10-CM | POA: Diagnosis not present

## 2021-02-16 DIAGNOSIS — Z79899 Other long term (current) drug therapy: Secondary | ICD-10-CM | POA: Diagnosis not present

## 2021-02-16 DIAGNOSIS — O99214 Obesity complicating childbirth: Secondary | ICD-10-CM | POA: Diagnosis not present

## 2021-02-16 DIAGNOSIS — I951 Orthostatic hypotension: Secondary | ICD-10-CM

## 2021-02-16 DIAGNOSIS — Z87891 Personal history of nicotine dependence: Secondary | ICD-10-CM | POA: Insufficient documentation

## 2021-02-16 DIAGNOSIS — R55 Syncope and collapse: Secondary | ICD-10-CM

## 2021-02-16 DIAGNOSIS — Z3A35 35 weeks gestation of pregnancy: Secondary | ICD-10-CM

## 2021-02-16 DIAGNOSIS — O0990 Supervision of high risk pregnancy, unspecified, unspecified trimester: Secondary | ICD-10-CM

## 2021-02-16 NOTE — OB Triage Note (Signed)
Patient reported to L&D after she fainted at home around 1300.  Patient states she has a history of low blood pressure with this pregnancy.  States she is being followed as high risk due to fibroid and previous c-section, states her c-section is scheduled for 7/22. Denies vaginal bleeding or leaking of fluid, reports normal fetal movement.

## 2021-02-16 NOTE — Telephone Encounter (Signed)
Preadmission screen  

## 2021-02-16 NOTE — Discharge Summary (Addendum)
Physician Final Progress Note  Patient ID: Paige Davidson MRN: [id number] DOB/AGE: 1991-07-14 30 y.o.  Admit date: 02/16/2021 Admitting provider: Malachy Mood, MD Discharge date: 02/16/2021   Admission Diagnoses: fainting episode while at home cooking  Discharge Diagnoses:  Active Problems:   Supervision of high risk pregnancy, antepartum   [redacted] weeks gestation of pregnancy   Syncope   History of Present Illness: The patient is a 30 y.o. female G3P1011 at [redacted]w[redacted]d who presents for a lightheaded episode around 1 PM today while at home with her 32 year old. She was in the kitchen cooking when her visual field went white and she felt lightheaded. She sat down and then fainted she thinks for 30 seconds or a minute. She denies falling. She called for an uber ride to [facility] after being told to go to the nearest hospital by her provider. Her care is at [facility] and she has a c/section scheduled for 02/27/21 at 37 weeks due to uterine scar. She admits usually staying well hydrated and eating regular meals/snacks with protein. She has experienced something similar one other time in the remote past. Last dose of Flexeril was about a week ago.   She was admitted for observation and placed on monitors. She declines IV fluids. All monitoring and vitals are wnl and reassuring. She does feel better since the fainting episode. She declines IV fluids. She is discharged to home with instructions and precautions and will have follow up with her provider.   Recommended follow up with cardiologist if the condition persists or worsens.  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    History of ectopic pregnancy 10/16/2018   Obesity (BMI 30-39.9) 09/01/2020   Pregnancy with history of uterine myomectomy 05/31/2019   Size > Dates 04/03/2019   [ ]  f/u mfm scan, needs serial growths -9/1: efw 87%, normal ac, afi  -9/21:  EFW 65th% AFI WNL   Status post primary low transverse cesarean section 06/02/2019   Hx myomectomy, tx as classical   Supervision of high risk pregnancy, antepartum 11/03/2018   Clinic [facility] Prenatal Labs Dating LMP Blood type: --/--/B POS Performed at [facility], [address] [place], [place] [address]  [phone])  Genetic Screen 1 Screen: Negative         Antibody:Negative (03/09 0952) Anatomic [place] Complete 7/6 Rubella: 3.01 (03/09 4034) Varicella: Immune GTT Early:               Third trimester: wnl RPR: Non Reactive (03/09 0952)  Rhogam N/A HBsAg:    Uterine leiomyoma 12/10/2013    Past Surgical History:  Procedure Laterality Date   CESAREAN SECTION N/A 05/31/2019   Procedure: CESAREAN SECTION;  Surgeon: Mora Bellman, MD;  Location: [facility];  Service: Obstetrics;  Laterality: N/A;   CYSTECTOMY     MYOMECTOMY  12/2018   [place] Dr. [name]. Sounds robotic assisted. pt states was told okay to labor   REMOVAL OF IMPLANON ROD  01/31/2020   surgery on left ear     bone removal    No current facility-administered medications on file prior to encounter.   Current Outpatient Medications on File Prior to Encounter  Medication Sig Dispense Refill   cyclobenzaprine (FLEXERIL) 10 MG tablet Take 1 tablet (10 mg total) by mouth 3 (three) times daily as needed for muscle spasms. 30 tablet 2  Prenat w/o A Vit-FeFum-FePo-FA (CONCEPT OB) 130-92.4-1 MG CAPS Take 1 capsule by mouth daily.     miconazole (MONISTAT 7) 2 % vaginal cream Place 1 Applicatorful vaginally at bedtime. Apply for seven nights (Patient not taking: No sig reported) 30 g 2    Allergies  Allergen Reactions   Penicillins Hives    Did it involve swelling of the face/tongue/throat, SOB, or low BP? No Did it involve sudden or severe rash/hives, skin peeling, or any reaction on the inside of your mouth or nose? Yes Did you need to seek medical attention at a hospital or doctor's office?  Unknown When did it last happen?      5+ years If all above answers are "NO", may proceed with cephalosporin use.     Social History   Socioeconomic History   Marital status: Married    Spouse name: Not on file   Number of children: Not on file   Years of education: Not on file   Highest education level: Not on file  Occupational History   Not on file  Tobacco Use   Smoking status: Never   Smokeless tobacco: Never  Vaping Use   Vaping Use: Former   Substances: Flavoring  Substance and Sexual Activity   Alcohol use: Not Currently    Comment: occasionally    Drug use: No   Sexual activity: Yes    Birth control/protection: None  Other Topics Concern   Not on file  Social History Narrative   Not on file   Social Determinants of Health   Financial Resource Strain: Not on file  Food Insecurity: Not on file  Transportation Needs: Not on file  Physical Activity: Not on file  Stress: Not on file  Social Connections: Not on file  Intimate Partner Violence: Not on file    Family History  Problem Relation Age of Onset   Breast cancer Maternal Grandmother    Diabetes Maternal Grandfather    Hypertension Paternal Grandfather    Breast cancer Paternal Aunt    Breast cancer Paternal Aunt      Review of Systems  Constitutional:  Negative for chills and fever.  HENT:  Negative for congestion, ear discharge, ear pain, hearing loss, sinus pain and sore throat.   Eyes:  Negative for blurred vision and double vision.  Respiratory:  Negative for cough, shortness of breath and wheezing.   Cardiovascular:  Negative for chest pain, palpitations and leg swelling.  Gastrointestinal:  Negative for abdominal pain, blood in stool, constipation, diarrhea, heartburn, melena, nausea and vomiting.  Genitourinary:  Negative for dysuria, flank pain, frequency, hematuria and urgency.  Musculoskeletal:  Negative for back pain, joint pain and myalgias.       Positive for pubic symphysis  dysfunction  Skin:  Negative for itching and rash.  Neurological:  Negative for dizziness, tingling, tremors, sensory change, speech change, focal weakness, seizures, loss of consciousness, weakness and headaches.       Positive for lightheaded and episode of syncope  Endo/Heme/Allergies:  Negative for environmental allergies. Does not bruise/bleed easily.  Psychiatric/Behavioral:  Negative for depression, hallucinations, memory loss, substance abuse and suicidal ideas. The patient is not nervous/anxious and does not have insomnia.     Physical Exam: BP 118/74   Pulse 92   Ht 5\' 6"  (1.676 m)   Wt 110.7 kg   LMP 06/23/2020 (Exact Date)   BMI 39.38 kg/m   Constitutional: Well nourished, well developed female in no acute distress.  HEENT: normal  Skin: Warm and dry.  Cardiovascular: Regular rate and rhythm.   Extremity:  no edema   Respiratory: Clear to auscultation bilateral. Normal respiratory effort Abdomen: FHT present Back: no CVAT Neuro: DTRs 2+, Cranial nerves grossly intact Psych: Alert and Oriented x3. No memory deficits. Normal mood and affect.  MS: normal gait, normal bilateral lower extremity ROM/strength/stability.  Patient Vitals for the past 24 hrs:  BP Pulse Height Weight  02/16/21 1439 118/74 92 -- --  02/16/21 1424 116/74 88 -- --  02/16/21 1417 -- -- 5\' 6"  (1.676 m) 110.7 kg  02/16/21 1408 114/79 94 -- --     Pelvic exam: deferred Toco: negative Fetal well being: 145 bpm, moderate variability, +accelerations, -decelerations   Consults: None  Significant Findings/ Diagnostic Studies: none  Procedures: NST  Hospital Course: The patient was admitted to Labor and Delivery Triage for observation.   Discharge Condition: good  Disposition: Discharge disposition: 01-Home or Self Care  Diet: Regular diet  Discharge Activity: Activity as tolerated  Discharge Instructions     Discharge activity:  No Restrictions   Complete by: As directed    Avoid  standing for long periods of time, change positions slowly   Discharge diet:  No restrictions   Complete by: As directed    Stay well hydrated and eat adequate protein   Notify physician for a general feeling that "something is not right"   Complete by: As directed    Notify physician for increase or change in vaginal discharge   Complete by: As directed    Notify physician for intestinal cramps, with or without diarrhea, sometimes described as "gas pain"   Complete by: As directed    Notify physician for leaking of fluid   Complete by: As directed    Notify physician for low, dull backache, unrelieved by heat or Tylenol   Complete by: As directed    Notify physician for menstrual like cramps   Complete by: As directed    Notify physician for pelvic pressure   Complete by: As directed    Notify physician for uterine contractions.  These may be painless and feel like the uterus is tightening or the baby is  "balling up"   Complete by: As directed    Notify physician for vaginal bleeding   Complete by: As directed    PRETERM LABOR:  Includes any of the follwing symptoms that occur between 20 - [redacted] weeks gestation.  If these symptoms are not stopped, preterm labor can result in preterm delivery, placing your baby at risk   Complete by: As directed       Allergies as of 02/16/2021       Reactions   Penicillins Hives   Did it involve swelling of the face/tongue/throat, SOB, or low BP? No Did it involve sudden or severe rash/hives, skin peeling, or any reaction on the inside of your mouth or nose? Yes Did you need to seek medical attention at a hospital or doctor's office? Unknown When did it last happen?      5+ years If all above answers are "NO", may proceed with cephalosporin use.        Medication List     STOP taking these medications    miconazole 2 % vaginal cream Commonly known as: MONISTAT 7       TAKE these medications    Concept OB 130-92.4-1 MG Caps Take 1  capsule by mouth daily.   cyclobenzaprine 10 MG tablet Commonly known as:  FLEXERIL Take 1 tablet (10 mg total) by mouth 3 (three) times daily as needed for muscle spasms.         Total time spent taking care of this patient: 22 minutes  Signed: Rod Can, CNM  02/16/2021, 3:12 PM

## 2021-02-16 NOTE — Progress Notes (Signed)
Fainting episode today at 1300, denies falling, states she sat down before she fainted.

## 2021-02-17 ENCOUNTER — Telehealth (HOSPITAL_COMMUNITY): Payer: Self-pay | Admitting: *Deleted

## 2021-02-17 NOTE — Telephone Encounter (Signed)
Preadmission screen  

## 2021-02-18 ENCOUNTER — Telehealth (HOSPITAL_COMMUNITY): Payer: Self-pay | Admitting: *Deleted

## 2021-02-18 ENCOUNTER — Encounter (HOSPITAL_COMMUNITY): Payer: Self-pay

## 2021-02-18 NOTE — Telephone Encounter (Signed)
Preadmission screen  

## 2021-02-19 ENCOUNTER — Encounter (HOSPITAL_COMMUNITY): Payer: Self-pay

## 2021-02-23 ENCOUNTER — Ambulatory Visit (INDEPENDENT_AMBULATORY_CARE_PROVIDER_SITE_OTHER): Payer: 59 | Admitting: Obstetrics & Gynecology

## 2021-02-23 ENCOUNTER — Other Ambulatory Visit: Payer: Self-pay

## 2021-02-23 VITALS — BP 116/76 | HR 97 | Wt 248.6 lb

## 2021-02-23 DIAGNOSIS — Z3A36 36 weeks gestation of pregnancy: Secondary | ICD-10-CM

## 2021-02-23 DIAGNOSIS — O163 Unspecified maternal hypertension, third trimester: Secondary | ICD-10-CM | POA: Diagnosis not present

## 2021-02-23 DIAGNOSIS — O0990 Supervision of high risk pregnancy, unspecified, unspecified trimester: Secondary | ICD-10-CM

## 2021-02-23 DIAGNOSIS — Z9889 Other specified postprocedural states: Secondary | ICD-10-CM

## 2021-02-23 NOTE — Progress Notes (Signed)
   PRENATAL VISIT NOTE  Subjective:  Paige Davidson is a 30 y.o. G3P1011 at [redacted]w[redacted]d being seen today for ongoing prenatal care.  She is currently monitored for the following issues for this high-risk pregnancy and has Supervision of high risk pregnancy, antepartum; Uterine fibroid during pregnancy, antepartum; Pelvic adhesive disease; History of myomectomy; Obesity in pregnancy, antepartum; History of postpartum depression, currently pregnant in second trimester; BMI 40.0-44.9, adult ([place]); and Syncope on their problem list.  Patient reports no complaints. Patient denies any headaches, visual symptoms, RUQ/epigastric pain or other concerning symptoms.  Contractions: Not present. Vag. Bleeding: None.  Movement: Present. Denies leaking of fluid.   The following portions of the patient's history were reviewed and updated as appropriate: allergies, current medications, past family history, past medical history, past social history, past surgical history and problem list.   Objective:   Vitals:   02/23/21 1505 02/23/21 1506 02/23/21 1550  BP: (!) 142/86 (!) 130/91 116/76  Pulse: 92 97   Weight: 248 lb 9.6 oz (112.8 kg)      Fetal Status: Fetal Heart Rate (bpm): 152   Movement: Present     General:  Alert, oriented and cooperative. Patient is in no acute distress.  Skin: Skin is warm and dry. No rash noted.   Cardiovascular: Normal heart rate noted  Respiratory: Normal respiratory effort, no problems with respiration noted  Abdomen: Soft, gravid, appropriate for gestational age.  Pain/Pressure: Present     Pelvic: Cervical exam deferred        Extremities: Normal range of motion.     Mental Status: Normal mood and affect. Normal behavior. Normal judgment and thought content.   Assessment and Plan:  Pregnancy: G3P1011 at [redacted]w[redacted]d 1. Elevated blood pressure affecting pregnancy in third trimester, antepartum No symptoms, reassured by normal last BP reading. Will check labs. NST performed  today was reviewed and was found to be reactive.  Discussed severe features with patient, precautions advised. Will recheck BP and NST in 2 days. Proceed with 37 week delivery as already planned. - Comprehensive metabolic panel - CBC - Protein / creatinine ratio, urine - Fetal nonstress test  2. History of myomectomy 3. [redacted] weeks gestation of pregnancy 4. Supervision of high risk pregnancy, antepartum Already scheduled for cesarean section on 02/27/21 at 37 weeks, may need earlier delivery if has severe features, labor or other concerning features. Preterm labor symptoms and general obstetric precautions including but not limited to vaginal bleeding, contractions, leaking of fluid and fetal movement were reviewed in detail with the patient. Please refer to After Visit Summary for other counseling recommendations.   Return in about 2 days (around 02/25/2021) for  BP Check, NST  03/09/2021: RN visit for PP incision check and BP check.  Future Appointments  Date Time Provider [place]  02/24/2021  3:30 PM [facility] NURSE [facility]  02/24/2021  3:45 PM [facility] US5 [facility]  02/25/2021  9:30 AM [facility] PAT 1 [facility] None  02/25/2021 10:30 AM [facility]-SCREENING [facility] None  02/25/2021  2:00 PM Elza Rafter, PT [facility] None    Verita Schneiders, MD

## 2021-02-23 NOTE — Patient Instructions (Signed)
Return to office for any scheduled appointments. Call the office or go to the MAU at [facility] at [facility] if: You begin to have strong, frequent contractions Your water breaks.  Sometimes it is a big gush of fluid, sometimes it is just a trickle that keeps getting your panties wet or running down your legs You have vaginal bleeding.  It is normal to have a small amount of spotting if your cervix was checked.  You do not feel your baby moving like normal.  If you do not, get something to eat and drink and lay down and focus on feeling your baby move.   If your baby is still not moving like normal, you should call the office or go to MAU. Any other obstetric concerns.  Cesarean Delivery Cesarean birth, or cesarean delivery, is the surgical delivery of a baby through an incision in the abdomen and the uterus. This may be referred to as a C-section. This procedure may be scheduled ahead of time, or it may be done inan emergency situation. Tell a health care provider about: Any allergies you have. All medicines you are taking, including vitamins, herbs, eye drops, creams, and over-the-counter medicines. Any problems you or family members have had with anesthetic medicines. Any blood disorders you have. Any surgeries you have had. Any medical conditions you have. Whether you or any members of your family have a history of deep vein thrombosis (DVT) or pulmonary embolism (PE). What are the risks? Generally, this is a safe procedure. However, problems may occur, including: Infection. Bleeding. Allergic reactions to medicines. Damage to other structures or organs. Blood clots. Injury to your baby. What happens before the procedure? General instructions Follow instructions from your health care provider about eating or drinking restrictions. If you know that you are going to have a cesarean delivery, do not shave your pubic area. Shaving before the procedure may increase your  risk of infection. Plan to have someone take you home from the hospital. Ask your health care provider what steps will be taken to prevent infection. These may include: Removing hair at the surgery site. Washing skin with a germ-killing soap. Taking antibiotic medicine. Depending on the reason for your cesarean delivery, you may have a physical exam or additional testing, such as an ultrasound. You may have your blood or urine tested. Questions for your health care provider Ask your health care provider about: Changing or stopping your regular medicines. This is especially important if you are taking diabetes medicines or blood thinners. Your pain management plan. This is especially important if you plan to breastfeed your baby. How long you will be in the hospital after the procedure. Any concerns you may have about receiving blood products, if you need them during the procedure. Cord blood banking, if you plan to collect your baby's umbilical cord blood. You may also want to ask your health care provider: Whether you will be able to hold or breastfeed your baby while you are still in the operating room. Whether your baby can stay with you immediately after the procedure and during your recovery. Whether a family member or a person of your choice can go with you into the operating room and stay with you during the procedure, immediately after the procedure, and during your recovery. What happens during the procedure?  An IV will be inserted into one of your veins. Fluid and medicines, such as antibiotics, will be given before the surgery. Fetal monitors will be placed  on your abdomen to check your baby's heart rate. You may be given a special warming gown to wear to keep your temperature stable. A catheter may be inserted into your bladder through your urethra. This drains your urine during the procedure. You may be given one or more of the following: A medicine to numb the area (local  anesthetic). A medicine to make you fall asleep (general anesthetic). A medicine (regional anesthetic) that is injected into your back or through a small thin tube placed in your back (spinal anesthetic or epidural anesthetic). This numbs everything below the injection site and allows you to stay awake during your procedure. If this makes you feel nauseous, tell your health care provider. Medicines will be available to help reduce any nausea you may feel. An incision will be made in your abdomen, and then in your uterus. If you are awake during your procedure, you may feel tugging and pulling in your abdomen, but you should not feel pain. If you feel pain, tell your health care provider immediately. Your baby will be removed from your uterus. You may feel more pressure or pushing while this happens. Immediately after birth, your baby will be dried and kept warm. You may be able to hold and breastfeed your baby. The umbilical cord may be clamped and cut during this time. This usually occurs after waiting a period of 1-2 minutes after delivery. Your placenta will be removed from your uterus. Your incisions will be closed with stitches (sutures). Staples, skin glue, or adhesive strips may also be applied to the incision in your abdomen. Bandages (dressings) may be placed over the incision in your abdomen. The procedure may vary among health care providers and hospitals. What happens after the procedure? Your blood pressure, heart rate, breathing rate, and blood oxygen level will be monitored until you are discharged from the hospital. You may continue to receive fluids and medicines through an IV. You will have some pain. Medicines will be available to help control your pain. To help prevent blood clots: You may be given medicines. You may have to wear compression stockings or devices. You will be encouraged to walk around when you are able. Hospital staff will encourage and support bonding with  your baby. Your hospital may have you and your baby to stay in the same room (rooming in) during your hospital stay to encourage successful bonding and breastfeeding. You may be encouraged to cough and breathe deeply often. This helps to prevent lung problems. If you have a catheter draining your urine, it will be removed as soon as possible after your procedure. Summary Cesarean birth, or cesarean delivery, is the surgical delivery of a baby through an incision in the abdomen and the uterus. Follow instructions from your health care provider about eating or drinking restrictions before the procedure. You will have some pain after the procedure. Medicines will be available to help control your pain. Hospital staff will encourage and support bonding with your baby after the procedure. Your hospital may have you and your baby to stay in the same room (rooming in) during your hospital stay to encourage successful bonding and breastfeeding. This information is not intended to replace advice given to you by your health care provider. Make sure you discuss any questions you have with your healthcare provider. Document Revised: 03/24/2020 Document Reviewed: 01/30/2018 Elsevier Patient Education  [place].

## 2021-02-24 ENCOUNTER — Ambulatory Visit (HOSPITAL_BASED_OUTPATIENT_CLINIC_OR_DEPARTMENT_OTHER): Payer: 59

## 2021-02-24 ENCOUNTER — Encounter: Payer: Self-pay | Admitting: *Deleted

## 2021-02-24 ENCOUNTER — Ambulatory Visit: Payer: 59 | Admitting: *Deleted

## 2021-02-24 VITALS — BP 108/71 | HR 95

## 2021-02-24 DIAGNOSIS — D251 Intramural leiomyoma of uterus: Secondary | ICD-10-CM | POA: Insufficient documentation

## 2021-02-24 DIAGNOSIS — D259 Leiomyoma of uterus, unspecified: Secondary | ICD-10-CM

## 2021-02-24 DIAGNOSIS — O3429 Maternal care due to uterine scar from other previous surgery: Secondary | ICD-10-CM

## 2021-02-24 DIAGNOSIS — Z3A37 37 weeks gestation of pregnancy: Secondary | ICD-10-CM | POA: Insufficient documentation

## 2021-02-24 DIAGNOSIS — O34219 Maternal care for unspecified type scar from previous cesarean delivery: Secondary | ICD-10-CM

## 2021-02-24 DIAGNOSIS — O09293 Supervision of pregnancy with other poor reproductive or obstetric history, third trimester: Secondary | ICD-10-CM | POA: Insufficient documentation

## 2021-02-24 DIAGNOSIS — Z8659 Personal history of other mental and behavioral disorders: Secondary | ICD-10-CM

## 2021-02-24 DIAGNOSIS — O3413 Maternal care for benign tumor of corpus uteri, third trimester: Secondary | ICD-10-CM

## 2021-02-24 DIAGNOSIS — R638 Other symptoms and signs concerning food and fluid intake: Secondary | ICD-10-CM

## 2021-02-24 DIAGNOSIS — E669 Obesity, unspecified: Secondary | ICD-10-CM

## 2021-02-24 DIAGNOSIS — O99213 Obesity complicating pregnancy, third trimester: Secondary | ICD-10-CM

## 2021-02-24 DIAGNOSIS — Z3A36 36 weeks gestation of pregnancy: Secondary | ICD-10-CM

## 2021-02-24 DIAGNOSIS — O99891 Other specified diseases and conditions complicating pregnancy: Secondary | ICD-10-CM

## 2021-02-24 LAB — COMPREHENSIVE METABOLIC PANEL
ALT: 5 IU/L (ref 0–32)
AST: 8 IU/L (ref 0–40)
Albumin/Globulin Ratio: 1.1 — ABNORMAL LOW (ref 1.2–2.2)
Albumin: 3.5 g/dL — ABNORMAL LOW (ref 3.8–4.8)
Alkaline Phosphatase: 135 IU/L — ABNORMAL HIGH (ref 44–121)
BUN/Creatinine Ratio: 11 (ref 9–23)
BUN: 7 mg/dL (ref 6–20)
Bilirubin Total: 0.3 mg/dL (ref 0.0–1.2)
CO2: 19 mmol/L — ABNORMAL LOW (ref 20–29)
Calcium: 9.1 mg/dL (ref 8.7–10.2)
Chloride: 101 mmol/L (ref 96–106)
Creatinine, Ser: 0.63 mg/dL (ref 0.57–1.00)
Globulin, Total: 3.1 g/dL (ref 1.5–4.5)
Glucose: 119 mg/dL — ABNORMAL HIGH (ref 65–99)
Potassium: 3.9 mmol/L (ref 3.5–5.2)
Sodium: 137 mmol/L (ref 134–144)
Total Protein: 6.6 g/dL (ref 6.0–8.5)
eGFR: 121 mL/min/{1.73_m2} (ref >59)

## 2021-02-24 LAB — CBC
Hematocrit: 32.9 % — ABNORMAL LOW (ref 34.0–46.6)
Hemoglobin: 11.3 g/dL (ref 11.1–15.9)
MCH: 30.1 pg (ref 26.6–33.0)
MCHC: 34.3 g/dL (ref 31.5–35.7)
MCV: 88 fL (ref 79–97)
Platelets: 287 10*3/uL (ref 150–450)
RBC: 3.75 x10E6/uL — ABNORMAL LOW (ref 3.77–5.28)
RDW: 12.8 % (ref 11.7–15.4)
WBC: 9.6 10*3/uL (ref 3.4–10.8)

## 2021-02-24 LAB — PROTEIN / CREATININE RATIO, URINE
Creatinine, Urine: 138.9 mg/dL
Protein, Ur: 48 mg/dL
Protein/Creat Ratio: 346 mg/g creat — ABNORMAL HIGH (ref 0–200)

## 2021-02-25 ENCOUNTER — Ambulatory Visit (INDEPENDENT_AMBULATORY_CARE_PROVIDER_SITE_OTHER): Payer: 59 | Admitting: *Deleted

## 2021-02-25 ENCOUNTER — Encounter (HOSPITAL_COMMUNITY)
Admission: RE | Admit: 2021-02-25 | Discharge: 2021-02-25 | Disposition: A | Discharge status: Home / Self Care | Payer: 59 | Source: Ambulatory Visit | Attending: Obstetrics & Gynecology | Admitting: Obstetrics & Gynecology

## 2021-02-25 ENCOUNTER — Other Ambulatory Visit: Payer: Self-pay

## 2021-02-25 ENCOUNTER — Other Ambulatory Visit (HOSPITAL_COMMUNITY)
Admission: RE | Admit: 2021-02-25 | Discharge: 2021-02-25 | Disposition: A | Discharge status: Home / Self Care | Payer: 59 | Source: Ambulatory Visit | Attending: Obstetrics & Gynecology | Admitting: Obstetrics & Gynecology

## 2021-02-25 ENCOUNTER — Ambulatory Visit: Payer: 59 | Attending: Obstetrics and Gynecology

## 2021-02-25 DIAGNOSIS — R278 Other lack of coordination: Secondary | ICD-10-CM | POA: Insufficient documentation

## 2021-02-25 DIAGNOSIS — M25551 Pain in right hip: Secondary | ICD-10-CM | POA: Insufficient documentation

## 2021-02-25 DIAGNOSIS — Z01812 Encounter for preprocedural laboratory examination: Secondary | ICD-10-CM | POA: Insufficient documentation

## 2021-02-25 DIAGNOSIS — Z20822 Contact with and (suspected) exposure to covid-19: Secondary | ICD-10-CM | POA: Insufficient documentation

## 2021-02-25 DIAGNOSIS — O0990 Supervision of high risk pregnancy, unspecified, unspecified trimester: Secondary | ICD-10-CM

## 2021-02-25 DIAGNOSIS — M6281 Muscle weakness (generalized): Secondary | ICD-10-CM | POA: Diagnosis present

## 2021-02-25 HISTORY — DX: Hypotension, unspecified: I95.9

## 2021-02-25 LAB — CBC
HCT: 33.9 % — ABNORMAL LOW (ref 36.0–46.0)
Hemoglobin: 11.6 g/dL — ABNORMAL LOW (ref 12.0–15.0)
MCH: 30.7 pg (ref 26.0–34.0)
MCHC: 34.2 g/dL (ref 30.0–36.0)
MCV: 89.7 fL (ref 80.0–100.0)
Platelets: 285 10*3/uL (ref 150–400)
RBC: 3.78 MIL/uL — ABNORMAL LOW (ref 3.87–5.11)
RDW: 12.9 % (ref 11.5–15.5)
WBC: 9.3 10*3/uL (ref 4.0–10.5)
nRBC: 0 % (ref 0.0–0.2)

## 2021-02-25 LAB — RPR: RPR Ser Ql: NONREACTIVE

## 2021-02-25 LAB — TYPE AND SCREEN
ABO/RH(D): B POS
Antibody Screen: NEGATIVE

## 2021-02-25 LAB — SARS CORONAVIRUS 2 (TAT 6-24 HRS): SARS Coronavirus 2: NEGATIVE

## 2021-02-25 NOTE — Therapy (Signed)
[place] MAIN [facility] SERVICES [address] [place], [place], [address] Phone: [phone]   Fax:  [phone]  Physical Therapy Treatment  Patient Details  Name: Paige Davidson MRN: [id number] Date of Birth: 03-Sep-1990 Referring Provider (PT): [name], MD   Encounter Date: 02/25/2021   PT End of Session - 02/25/21 1428     Visit Number 2    Number of Visits 5    Date for PT Re-Evaluation 03/27/21    Authorization Type [employer]- 60 visits per year    Authorization - Visit Number 2    Authorization - Number of Visits 41    PT Start Time 1401    PT Stop Time 1456    PT Time Calculation (min) 55 min    Activity Tolerance Patient tolerated treatment well    Behavior During Therapy [facility] for tasks assessed/performed             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    History of ectopic pregnancy 10/16/2018   Hypotension    Obesity (BMI 30-39.9) 09/01/2020   Pregnancy with history of uterine myomectomy 05/31/2019   Size > Dates 04/03/2019   [ ] f/u mfm scan, needs serial growths -9/1: efw 87%, normal ac, afi -9/21:  EFW 65th% AFI WNL   Status post primary low transverse cesarean section 06/02/2019   Hx myomectomy, tx as classical   Supervision of high risk pregnancy, antepartum 11/03/2018   Clinic [facility] Prenatal Labs Dating LMP Blood type: --/--/B POS Performed at [facility], [address], [place],  [address]  [phone])  Genetic Screen 1 Screen: Negative         Antibody:Negative (03/09 0952) Anatomic [place] Complete 7/6 Rubella: 3.01 (03/09 4174) Varicella: Immune GTT Early:               Third trimester: wnl RPR: Non Reactive (03/09 0952)  Rhogam N/A HBsAg:    Uterine leiomyoma 12/10/2013    Past Surgical History:  Procedure Laterality Date   CESAREAN SECTION N/A 05/31/2019   Procedure:  CESAREAN SECTION;  Surgeon: Mora Bellman, MD;  Location: [facility];  Service: Obstetrics;  Laterality: N/A;   CYSTECTOMY     MYOMECTOMY  12/2018   [place] Dr. [name]. Sounds robotic assisted. pt states was told okay to labor   REMOVAL OF IMPLANON ROD  01/31/2020   surgery on left ear     bone removal    There were no vitals filed for this visit.   Subjective Assessment - 02/25/21 1404     Subjective Pt reported she's tired today from going to appointments today. Pt has switched to wearing sandals with heel straps or tennis shoes which has helped. Pt was wearing a abdominal support belt until last week when contractions started-intermittent contractions.    Pertinent History Uterine fibroids, high risk pregnancy, pelvic adhesive disease, hx of postpartum depression    Patient Stated Goals I would like to be able to reduce pain, strengthen muscles to not injure herself during recovery.    Currently in Pain? No/denies                  SELF CARE: Toileting posture:  1) Peeing posture: feet flat, lean forward, forearms on your legs to relax pelvic floor.  2) Pooping posture: place feet flat on Squatty  Potty or stool that keeps knee joint higher than hip joint, lean forward so forearms are on legs.    Lightheadedness: Can occur during lying down to sit and sit to stand. Pump arms x10 reps in seated position if you feel lightheaded. Wait 30 seconds prior to walking after you go from sitting to standing. Elevating legs can also help in seated position. Hydration and electrolytes are important.    Access Code: [email or web address] URL: [email or web address] Date: 02/25/2021 Prepared by: Geoffry Paradise  NMR:  EXercises Sidelying Pelvic Floor Contraction with Self-Palpation - 1 x daily - 7 x weekly - 1 sets - 10 reps Supine Pelvic Tilt - 1 x daily - 7 x weekly - 3 sets - 10 reps   **Ask for referral to pelvic health PT six weeks postpartum.    MANUAL  THERAPY: pt in R sidelying: Massage to L hip, quad lum., piriformis to reduce tension and trigger points. Pt reported reduced discomfort after massage. PT performed Grade 2-3 PA joint mobs to T4-T12, 3x30 sec. Bouts to improve thoracic extension and discomfort. Pt reported improved discomfort after joint mobs.                SELF CARE:  PT Education - 02/25/21 1427     Education Details PT educated pt on toileting posture and squatty potty. PT provided pt with HEP and information on improving lightheadedness 2/2 hypotension during pregnancy. PT educated pt that this will be last visit prior to induction on 02/27/21 and pt will require new referral to pelvic health PT at six weeks postpartum.    Person(s) Educated Patient    Methods Explanation;Demonstration;Tactile cues;Verbal cues;Handout    Comprehension Returned demonstration;Verbalized understanding;Need further instruction              PT Short Term Goals - 01/26/21 1259       PT SHORT TERM GOAL #1   Title STGs = LTGs               PT Long Term Goals - 02/25/21 1615       PT LONG TERM GOAL #1   Title Pt will be IND in HEP to improve pain, strength, and posture. TARGET DATE FOR ALL LTGS: 02/23/21    Time 4    Period Weeks    Status Partially Met      PT LONG TERM GOAL #2   Title Have pt complete FOTO survery and write goal prn.    Time 4    Period Weeks    Status Deferred      PT LONG TERM GOAL #3   Title Pt will report pelvic pain </= 3/10 during all ADLs to improve QOL and ability to perform work.    Baseline 9/10 at worst    Time 4    Period Weeks    Status Not Met      PT LONG TERM GOAL #4   Title Pt will be able to properly donn and doff pregnancy belt to wear throughout the day to decr. pain.    Time 4    Period Weeks    Status Achieved                   Plan - 02/25/21 1613     Clinical Impression Statement Today's skilled therapy focused on providing pt with self care and  HEP to perform leading up to induction on 02/27/21 and postpartum care. Pt demonstrated progress as she had proper shoes donned and  reported decr. hip pain. However, B knee pain incr. this week. Pt continues to require cues to correctly perform pelvic floor contraction with technique improving after several reps. Pt met LTG 4 but other LTGs either deferred, not met or partially met as pt only had one appt. after eval. PT reiterated the importance of seeking mental health care as needed postpartum and that pt will require a new referral six weeks postpartum. Please see d/c summary for details.    Personal Factors and Comorbidities Comorbidity 3+;Fitness;Profession    Comorbidities Uterine fibroids, high risk pregnancy, pelvic adhesive disease, hx of postpartum depression    Examination-Activity Limitations Bed Mobility;Bend;Locomotion Level;Caring for Others;Carry;Sleep;Sit;Squat;Stand;Transfers;Lift    Examination-Participation Restrictions Laundry;Cleaning;Occupation;Interpersonal Relationship    Stability/Clinical Decision Making Evolving/Moderate complexity    Rehab Potential Good    PT Frequency 1x / week    PT Duration --   5 weeks until 02/27/21   PT Treatment/Interventions ADLs/Self Care Home Management;Biofeedback;Functional mobility training;Stair training;Gait training;Therapeutic activities;Therapeutic exercise;Balance training;Neuromuscular re-education;Patient/family education;Manual techniques;Dry needling    PT Next Visit Plan Pelvic floor contraction coordination, pelvic clocks, TrA activation, R hip ROM-MT    Consulted and Agree with Plan of Care Patient             Patient will benefit from skilled therapeutic intervention in order to improve the following deficits and impairments:  Abnormal gait, Decreased balance, Decreased endurance, Decreased mobility, Pain, Postural dysfunction, Decreased strength, Decreased coordination, Decreased range of motion  Visit Diagnosis: Other  lack of coordination  Muscle weakness (generalized)  Pain in right hip     Problem List Patient Active Problem List   Diagnosis Date Noted   Syncope 02/16/2021   BMI 40.0-44.9, adult ([place]) 02/02/2021   History of postpartum depression, currently pregnant in second trimester 10/16/2020   Obesity in pregnancy, antepartum 09/01/2020   History of myomectomy 04/30/2019   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 [place], [place], [address] Phone: [phone]   Fax:  [phone]  Name: Paige Davidson MRN: [id number] Date of Birth: October 03, 1990   PHYSICAL THERAPY DISCHARGE SUMMARY  Visits from Start of Care: 2  Current functional level related to goals / functional outcomes:  PT Long Term Goals - 02/25/21 1615       PT LONG TERM GOAL #1   Title Pt will be IND in HEP to improve pain, strength, and posture. TARGET DATE FOR ALL LTGS: 02/23/21    Time 4    Period Weeks    Status Partially Met      PT LONG TERM GOAL #2   Title Have pt complete FOTO survery and write goal prn.    Time 4    Period Weeks    Status Deferred      PT LONG TERM GOAL #3   Title Pt will report pelvic pain </= 3/10 during all ADLs to improve QOL and ability to perform work.    Baseline 9/10 at worst    Time 4    Period Weeks    Status Not Met      PT LONG TERM GOAL #4   Title Pt will be able to properly donn and doff pregnancy belt to wear throughout the day to decr. pain.    Time 4    Period Weeks  Status Achieved                Remaining deficits: Pain, weakness, impaired posture   Education / Equipment: HEP and self care   Patient agrees to discharge. Patient goals were partially met. Patient is being discharged due to induction  scheduled for 02/27/21.  Geoffry Paradise, PT,DPT 02/25/21 4:21 PM Phone: [phone] Fax: [phone]

## 2021-02-25 NOTE — Patient Instructions (Addendum)
Toileting posture:  1) Peeing posture: feet flat, lean forward, forearms on your legs to relax pelvic floor.  2) Pooping posture: place feet flat on Squatty Potty or stool that keeps knee joint higher than hip joint, lean forward so forearms are on legs.    Lightheadedness: Can occur during lying down to sit and sit to stand. Pump arms x10 reps in seated position if you feel lightheaded. Wait 30 seconds prior to walking after you go from sitting to standing. Elevating legs can also help in seated position. Hydration and electrolytes are important.    Access Code: [email or web address] URL: [email or web address] Date: 02/25/2021 Prepared by: Geoffry Paradise  Exercises Sidelying Pelvic Floor Contraction with Self-Palpation - 1 x daily - 7 x weekly - 1 sets - 10 reps Supine Pelvic Tilt - 1 x daily - 7 x weekly - 3 sets - 10 reps   **Ask for referral to pelvic health PT six weeks postpartum.

## 2021-02-25 NOTE — Progress Notes (Signed)
Subjective:  Paige Davidson is a 29 y.o. female here for BP check and NST.  Hypertension ROS: no chest pain on exertion, no dyspnea on exertion, and no swelling of ankles.    Objective:  LMP 06/23/2020 (Exact Date)   BP 124/85 Appearance alert, well appearing, and in no distress. General exam BP noted to be well controlled today in office.  NST-Reactive and Reassuring    Assessment:   Blood Pressure well controlled.   Plan:  Pt has c-section scheduled on 7/22, and pt to follow up as needed. Marland Kitchen

## 2021-02-26 ENCOUNTER — Encounter (HOSPITAL_COMMUNITY): Payer: Self-pay | Admitting: Obstetrics & Gynecology

## 2021-02-26 MED ORDER — DEXTROSE 5 % IV SOLN
5.0000 mg/kg | INTRAVENOUS | Status: AC
  Administered 2021-02-27: 403.6 mg via INTRAVENOUS
  Filled 2021-02-26: qty 10

## 2021-02-26 MED ORDER — CLINDAMYCIN PHOSPHATE 900 MG/50ML IV SOLN
900.0000 mg | INTRAVENOUS | Status: AC
  Administered 2021-02-27: 900 mg via INTRAVENOUS

## 2021-02-26 NOTE — Progress Notes (Signed)
Patient was assessed and managed by nursing staff during this encounter. I have reviewed the chart and agree with the documentation and plan.   Verita Schneiders, MD 02/26/2021 12:25 PM

## 2021-02-26 NOTE — Anesthesia Preprocedure Evaluation (Addendum)
Anesthesia Evaluation  Patient identified by MRN, date of birth, ID band Patient awake    Reviewed: Allergy & Precautions, NPO status , Patient's Chart, lab work & pertinent test results  History of Anesthesia Complications Negative for: history of anesthetic complications  Airway Mallampati: II  TM Distance: >3 FB Neck ROM: Full    Dental no notable dental hx.    Pulmonary neg pulmonary ROS,    Pulmonary exam normal breath sounds clear to auscultation       Cardiovascular negative cardio ROS Normal cardiovascular exam Rhythm:Regular Rate:Normal     Neuro/Psych negative neurological ROS  negative psych ROS   GI/Hepatic negative GI ROS, Neg liver ROS,   Endo/Other  negative endocrine ROS  Renal/GU negative Renal ROS  negative genitourinary   Musculoskeletal negative musculoskeletal ROS (+)   Abdominal   Peds negative pediatric ROS (+)  Hematology negative hematology ROS (+)   Anesthesia Other Findings   Reproductive/Obstetrics negative OB ROS (+) Pregnancy Hx of fibroids s/p myomectomy                            Anesthesia Physical  Anesthesia Plan  ASA: 2  Anesthesia Plan: Spinal   Post-op Pain Management:    Induction:   PONV Risk Score and Plan: 4 or greater and Treatment may vary due to age or medical condition, Ondansetron, Dexamethasone and Scopolamine patch - Pre-op  Airway Management Planned: Natural Airway  Additional Equipment: None  Intra-op Plan:   Post-operative Plan:   Informed Consent: I have reviewed the patients History and Physical, chart, labs and discussed the procedure including the risks, benefits and alternatives for the proposed anesthesia with the patient or authorized representative who has indicated his/her understanding and acceptance.     Dental advisory given  Plan Discussed with: CRNA and Anesthesiologist  Anesthesia Plan Comments:  (Spinal. GETA as backup )       Anesthesia Quick Evaluation

## 2021-02-27 ENCOUNTER — Inpatient Hospital Stay (HOSPITAL_COMMUNITY): Payer: 59 | Admitting: Anesthesiology

## 2021-02-27 ENCOUNTER — Encounter (HOSPITAL_COMMUNITY): Payer: Self-pay | Admitting: Obstetrics & Gynecology

## 2021-02-27 ENCOUNTER — Other Ambulatory Visit: Payer: Self-pay

## 2021-02-27 ENCOUNTER — Encounter (HOSPITAL_COMMUNITY)
Admission: RE | Disposition: A | Discharge status: Home / Self Care | Payer: Self-pay | Source: Home / Self Care | Attending: Obstetrics & Gynecology

## 2021-02-27 ENCOUNTER — Inpatient Hospital Stay (HOSPITAL_COMMUNITY)
Admission: RE | Admit: 2021-02-27 | Discharge: 2021-03-02 | DRG: 788 | Disposition: A | Discharge status: Home / Self Care | Payer: 59 | Attending: Obstetrics & Gynecology | Admitting: Obstetrics & Gynecology

## 2021-02-27 DIAGNOSIS — D259 Leiomyoma of uterus, unspecified: Secondary | ICD-10-CM | POA: Diagnosis present

## 2021-02-27 DIAGNOSIS — Z20822 Contact with and (suspected) exposure to covid-19: Secondary | ICD-10-CM | POA: Diagnosis present

## 2021-02-27 DIAGNOSIS — O99214 Obesity complicating childbirth: Secondary | ICD-10-CM | POA: Diagnosis present

## 2021-02-27 DIAGNOSIS — O3413 Maternal care for benign tumor of corpus uteri, third trimester: Secondary | ICD-10-CM | POA: Diagnosis present

## 2021-02-27 DIAGNOSIS — Z98891 History of uterine scar from previous surgery: Secondary | ICD-10-CM

## 2021-02-27 DIAGNOSIS — O9902 Anemia complicating childbirth: Secondary | ICD-10-CM | POA: Diagnosis present

## 2021-02-27 DIAGNOSIS — Z3A37 37 weeks gestation of pregnancy: Secondary | ICD-10-CM

## 2021-02-27 DIAGNOSIS — Z3490 Encounter for supervision of normal pregnancy, unspecified, unspecified trimester: Secondary | ICD-10-CM

## 2021-02-27 DIAGNOSIS — O34211 Maternal care for low transverse scar from previous cesarean delivery: Principal | ICD-10-CM

## 2021-02-27 SURGERY — Surgical Case
Anesthesia: Spinal | Wound class: Clean Contaminated

## 2021-02-27 MED ORDER — DEXAMETHASONE SODIUM PHOSPHATE 10 MG/ML IJ SOLN
INTRAMUSCULAR | Status: AC
  Filled 2021-02-27: qty 1

## 2021-02-27 MED ORDER — DIPHENHYDRAMINE HCL 25 MG PO CAPS: 25.0000 mg | ORAL_CAPSULE | Freq: Four times a day (QID) | ORAL | Status: DC | PRN

## 2021-02-27 MED ORDER — OXYCODONE HCL 5 MG PO TABS
5.0000 mg | ORAL_TABLET | Freq: Once | ORAL | Status: DC | PRN
Start: 2021-02-27 — End: 2021-02-27

## 2021-02-27 MED ORDER — ENOXAPARIN SODIUM 60 MG/0.6ML IJ SOSY
60.0000 mg | PREFILLED_SYRINGE | INTRAMUSCULAR | Status: DC
  Administered 2021-02-28 – 2021-03-02 (×3): 60 mg via SUBCUTANEOUS
  Filled 2021-02-27 (×3): qty 0.6

## 2021-02-27 MED ORDER — DEXAMETHASONE SODIUM PHOSPHATE 4 MG/ML IJ SOLN
INTRAMUSCULAR | Status: DC | PRN
  Administered 2021-02-27: 10 mg via INTRAVENOUS

## 2021-02-27 MED ORDER — POVIDONE-IODINE 10 % EX SWAB
2.0000 "application " | Freq: Once | CUTANEOUS | Status: AC
  Administered 2021-02-27: 2 via TOPICAL

## 2021-02-27 MED ORDER — SOD CITRATE-CITRIC ACID 500-334 MG/5ML PO SOLN
ORAL | Status: AC
  Filled 2021-02-27: qty 30

## 2021-02-27 MED ORDER — MENTHOL 3 MG MT LOZG: 1.0000 | LOZENGE | OROMUCOSAL | Status: DC | PRN

## 2021-02-27 MED ORDER — SIMETHICONE 80 MG PO CHEW: 80.0000 mg | CHEWABLE_TABLET | ORAL | Status: DC | PRN

## 2021-02-27 MED ORDER — KETOROLAC TROMETHAMINE 30 MG/ML IJ SOLN: 30.0000 mg | Freq: Four times a day (QID) | INTRAMUSCULAR | Status: AC | PRN

## 2021-02-27 MED ORDER — TETANUS-DIPHTH-ACELL PERTUSSIS 5-2.5-18.5 LF-MCG/0.5 IM SUSY: 0.5000 mL | PREFILLED_SYRINGE | Freq: Once | INTRAMUSCULAR | Status: DC

## 2021-02-27 MED ORDER — ACETAMINOPHEN 10 MG/ML IV SOLN
INTRAVENOUS | Status: DC | PRN
Start: 2021-02-27 — End: 2021-02-27
  Administered 2021-02-27: 1000 mg via INTRAVENOUS

## 2021-02-27 MED ORDER — KETOROLAC TROMETHAMINE 30 MG/ML IJ SOLN
30.0000 mg | Freq: Four times a day (QID) | INTRAMUSCULAR | Status: AC
  Administered 2021-02-27 – 2021-02-28 (×3): 30 mg via INTRAVENOUS
  Filled 2021-02-27 (×3): qty 1

## 2021-02-27 MED ORDER — MORPHINE SULFATE (PF) 0.5 MG/ML IJ SOLN
INTRAMUSCULAR | Status: AC
  Filled 2021-02-27: qty 10

## 2021-02-27 MED ORDER — SENNOSIDES-DOCUSATE SODIUM 8.6-50 MG PO TABS
2.0000 | ORAL_TABLET | Freq: Every day | ORAL | Status: DC
  Administered 2021-02-28 – 2021-03-01 (×2): 2 via ORAL
  Filled 2021-02-27 (×2): qty 2

## 2021-02-27 MED ORDER — SIMETHICONE 80 MG PO CHEW
80.0000 mg | CHEWABLE_TABLET | Freq: Three times a day (TID) | ORAL | Status: DC
  Administered 2021-02-27 – 2021-03-01 (×7): 80 mg via ORAL
  Filled 2021-02-27 (×7): qty 1

## 2021-02-27 MED ORDER — PHENYLEPHRINE HCL-NACL 20-0.9 MG/250ML-% IV SOLN
INTRAVENOUS | Status: AC
  Filled 2021-02-27: qty 250

## 2021-02-27 MED ORDER — ONDANSETRON HCL 4 MG/2ML IJ SOLN
INTRAMUSCULAR | Status: AC
  Filled 2021-02-27: qty 2

## 2021-02-27 MED ORDER — DIBUCAINE (PERIANAL) 1 % EX OINT: 1.0000 "application " | TOPICAL_OINTMENT | CUTANEOUS | Status: DC | PRN

## 2021-02-27 MED ORDER — NALOXONE HCL 0.4 MG/ML IJ SOLN: 0.4000 mg | INTRAMUSCULAR | Status: DC | PRN

## 2021-02-27 MED ORDER — SOD CITRATE-CITRIC ACID 500-334 MG/5ML PO SOLN
30.0000 mL | Freq: Once | ORAL | Status: AC
  Administered 2021-02-27: 30 mL via ORAL

## 2021-02-27 MED ORDER — MEDROXYPROGESTERONE ACETATE 150 MG/ML IM SUSP: 150.0000 mg | INTRAMUSCULAR | Status: DC | PRN

## 2021-02-27 MED ORDER — FENTANYL CITRATE (PF) 100 MCG/2ML IJ SOLN
INTRAMUSCULAR | Status: DC | PRN
  Administered 2021-02-27: 15 ug via INTRATHECAL

## 2021-02-27 MED ORDER — MORPHINE SULFATE (PF) 0.5 MG/ML IJ SOLN
INTRAMUSCULAR | Status: DC | PRN
  Administered 2021-02-27: .15 mg via INTRATHECAL

## 2021-02-27 MED ORDER — OXYCODONE HCL 5 MG/5ML PO SOLN: 5.0000 mg | Freq: Once | ORAL | Status: DC | PRN

## 2021-02-27 MED ORDER — ACETAMINOPHEN 325 MG PO TABS
650.0000 mg | ORAL_TABLET | ORAL | Status: DC | PRN
  Administered 2021-03-01 (×3): 650 mg via ORAL
  Filled 2021-02-27 (×3): qty 2

## 2021-02-27 MED ORDER — BUPIVACAINE HCL (PF) 0.5 % IJ SOLN
INTRAMUSCULAR | Status: AC
  Filled 2021-02-27: qty 30

## 2021-02-27 MED ORDER — DIPHENHYDRAMINE HCL 25 MG PO CAPS: 25.0000 mg | ORAL_CAPSULE | ORAL | Status: DC | PRN

## 2021-02-27 MED ORDER — NALBUPHINE HCL 10 MG/ML IJ SOLN
5.0000 mg | Freq: Once | INTRAMUSCULAR | Status: DC | PRN
Start: 2021-02-27 — End: 2021-03-02

## 2021-02-27 MED ORDER — NALBUPHINE HCL 10 MG/ML IJ SOLN
5.0000 mg | INTRAMUSCULAR | Status: DC | PRN
Start: 2021-02-27 — End: 2021-03-02

## 2021-02-27 MED ORDER — LACTATED RINGERS IV SOLN: INTRAVENOUS | Status: DC

## 2021-02-27 MED ORDER — NALBUPHINE HCL 10 MG/ML IJ SOLN: 5.0000 mg | INTRAMUSCULAR | Status: DC | PRN

## 2021-02-27 MED ORDER — BUPIVACAINE IN DEXTROSE 0.75-8.25 % IT SOLN
INTRATHECAL | Status: DC | PRN
  Administered 2021-02-27: 1.6 mL via INTRATHECAL

## 2021-02-27 MED ORDER — BUPIVACAINE HCL (PF) 0.5 % IJ SOLN
INTRAMUSCULAR | Status: DC | PRN
  Administered 2021-02-27: 30 mL

## 2021-02-27 MED ORDER — ACETAMINOPHEN 10 MG/ML IV SOLN
INTRAVENOUS | Status: AC
  Filled 2021-02-27: qty 100

## 2021-02-27 MED ORDER — MEPERIDINE HCL 25 MG/ML IJ SOLN: 6.2500 mg | INTRAMUSCULAR | Status: DC | PRN

## 2021-02-27 MED ORDER — PRENATAL MULTIVITAMIN CH
1.0000 | ORAL_TABLET | Freq: Every day | ORAL | Status: DC
  Administered 2021-02-28 – 2021-03-01 (×2): 1 via ORAL
  Filled 2021-02-27 (×2): qty 1

## 2021-02-27 MED ORDER — DIPHENHYDRAMINE HCL 50 MG/ML IJ SOLN: 12.5000 mg | INTRAMUSCULAR | Status: DC | PRN

## 2021-02-27 MED ORDER — NALOXONE HCL 4 MG/10ML IJ SOLN
1.0000 ug/kg/h | INTRAVENOUS | Status: DC | PRN
  Filled 2021-02-27: qty 5

## 2021-02-27 MED ORDER — KETOROLAC TROMETHAMINE 30 MG/ML IJ SOLN
30.0000 mg | Freq: Four times a day (QID) | INTRAMUSCULAR | Status: AC | PRN
  Administered 2021-02-28: 30 mg via INTRAVENOUS

## 2021-02-27 MED ORDER — OXYTOCIN-SODIUM CHLORIDE 30-0.9 UT/500ML-% IV SOLN: 2.5000 [IU]/h | INTRAVENOUS | Status: AC

## 2021-02-27 MED ORDER — FENTANYL CITRATE (PF) 100 MCG/2ML IJ SOLN: 25.0000 ug | INTRAMUSCULAR | Status: DC | PRN

## 2021-02-27 MED ORDER — WITCH HAZEL-GLYCERIN EX PADS: 1.0000 "application " | MEDICATED_PAD | CUTANEOUS | Status: DC | PRN

## 2021-02-27 MED ORDER — ACETAMINOPHEN 10 MG/ML IV SOLN: 1000.0000 mg | Freq: Once | INTRAVENOUS | Status: DC | PRN

## 2021-02-27 MED ORDER — ACETAMINOPHEN 500 MG PO TABS
1000.0000 mg | ORAL_TABLET | Freq: Four times a day (QID) | ORAL | Status: AC
  Administered 2021-02-27 – 2021-02-28 (×4): 1000 mg via ORAL
  Filled 2021-02-27 (×4): qty 2

## 2021-02-27 MED ORDER — FENTANYL CITRATE (PF) 100 MCG/2ML IJ SOLN
INTRAMUSCULAR | Status: AC
  Filled 2021-02-27: qty 2

## 2021-02-27 MED ORDER — SODIUM CHLORIDE 0.9% FLUSH: 3.0000 mL | INTRAVENOUS | Status: DC | PRN

## 2021-02-27 MED ORDER — OXYTOCIN-SODIUM CHLORIDE 30-0.9 UT/500ML-% IV SOLN
INTRAVENOUS | Status: AC
  Filled 2021-02-27: qty 500

## 2021-02-27 MED ORDER — SCOPOLAMINE 1 MG/3DAYS TD PT72
1.0000 | MEDICATED_PATCH | Freq: Once | TRANSDERMAL | Status: DC
Start: 2021-02-27 — End: 2021-03-02
  Administered 2021-02-27: 1.5 mg via TRANSDERMAL

## 2021-02-27 MED ORDER — SCOPOLAMINE 1 MG/3DAYS TD PT72
MEDICATED_PATCH | TRANSDERMAL | Status: AC
  Filled 2021-02-27: qty 1

## 2021-02-27 MED ORDER — CLINDAMYCIN PHOSPHATE 900 MG/50ML IV SOLN
INTRAVENOUS | Status: AC
  Filled 2021-02-27: qty 50

## 2021-02-27 MED ORDER — PROMETHAZINE HCL 25 MG/ML IJ SOLN: 6.2500 mg | INTRAMUSCULAR | Status: DC | PRN

## 2021-02-27 MED ORDER — IBUPROFEN 600 MG PO TABS
600.0000 mg | ORAL_TABLET | Freq: Four times a day (QID) | ORAL | Status: DC
  Administered 2021-02-28 – 2021-03-02 (×8): 600 mg via ORAL
  Filled 2021-02-27 (×8): qty 1

## 2021-02-27 MED ORDER — ONDANSETRON HCL 4 MG/2ML IJ SOLN: 4.0000 mg | Freq: Three times a day (TID) | INTRAMUSCULAR | Status: DC | PRN

## 2021-02-27 MED ORDER — PHENYLEPHRINE HCL-NACL 20-0.9 MG/250ML-% IV SOLN
INTRAVENOUS | Status: DC | PRN
  Administered 2021-02-27: 60 ug/min via INTRAVENOUS

## 2021-02-27 MED ORDER — OXYCODONE HCL 5 MG PO TABS
5.0000 mg | ORAL_TABLET | ORAL | Status: DC | PRN
  Administered 2021-02-28: 5 mg via ORAL
  Administered 2021-03-01 – 2021-03-02 (×3): 10 mg via ORAL
  Filled 2021-02-27: qty 2
  Filled 2021-02-27: qty 1
  Filled 2021-02-27 (×2): qty 2

## 2021-02-27 MED ORDER — KETOROLAC TROMETHAMINE 30 MG/ML IJ SOLN
INTRAMUSCULAR | Status: AC
  Filled 2021-02-27: qty 1

## 2021-02-27 MED ORDER — MEASLES, MUMPS & RUBELLA VAC IJ SOLR: 0.5000 mL | Freq: Once | INTRAMUSCULAR | Status: DC

## 2021-02-27 MED ORDER — OXYTOCIN-SODIUM CHLORIDE 30-0.9 UT/500ML-% IV SOLN
INTRAVENOUS | Status: DC | PRN
  Administered 2021-02-27 (×2): 30 [IU] via INTRAVENOUS

## 2021-02-27 MED ORDER — ONDANSETRON HCL 4 MG/2ML IJ SOLN
INTRAMUSCULAR | Status: DC | PRN
Start: 2021-02-27 — End: 2021-02-27
  Administered 2021-02-27: 4 mg via INTRAVENOUS

## 2021-02-27 MED ORDER — COCONUT OIL OIL
1.0000 "application " | TOPICAL_OIL | Status: DC | PRN
  Administered 2021-03-01: 1 via TOPICAL

## 2021-02-27 MED ORDER — KETOROLAC TROMETHAMINE 30 MG/ML IJ SOLN: 30.0000 mg | Freq: Once | INTRAMUSCULAR | Status: DC

## 2021-02-27 SURGICAL SUPPLY — 33 items
BARRIER ADHS 3X4 INTERCEED (GAUZE/BANDAGES/DRESSINGS) IMPLANT
CHLORAPREP W/TINT 26ML (MISCELLANEOUS) ×2 IMPLANT
CLAMP CORD UMBIL (MISCELLANEOUS) IMPLANT
CLIP FILSHIE TUBAL LIGA STRL (Clip) IMPLANT
CLOTH BEACON ORANGE TIMEOUT ST (SAFETY) ×2 IMPLANT
DRSG OPSITE POSTOP 4X10 (GAUZE/BANDAGES/DRESSINGS) ×2 IMPLANT
ELECT REM PT RETURN 9FT ADLT (ELECTROSURGICAL) ×2
ELECTRODE REM PT RTRN 9FT ADLT (ELECTROSURGICAL) ×1 IMPLANT
EXTRACTOR VACUUM KIWI (MISCELLANEOUS) IMPLANT
GLOVE BIO SURGEON STRL SZ 6.5 (GLOVE) ×2 IMPLANT
GLOVE BIOGEL PI IND STRL 7.0 (GLOVE) ×2 IMPLANT
GLOVE BIOGEL PI INDICATOR 7.0 (GLOVE) ×2
GOWN STRL REUS W/TWL LRG LVL3 (GOWN DISPOSABLE) ×4 IMPLANT
KIT ABG SYR 3ML LUER SLIP (SYRINGE) IMPLANT
NEEDLE HYPO 22GX1.5 SAFETY (NEEDLE) ×2 IMPLANT
NEEDLE HYPO 25X5/8 SAFETYGLIDE (NEEDLE) IMPLANT
NS IRRIG 1000ML POUR BTL (IV SOLUTION) ×2 IMPLANT
PACK C SECTION WH (CUSTOM PROCEDURE TRAY) ×2 IMPLANT
PAD ABD 7.5X8 STRL (GAUZE/BANDAGES/DRESSINGS) ×2 IMPLANT
PAD OB MATERNITY 4.3X12.25 (PERSONAL CARE ITEMS) ×2 IMPLANT
PENCIL SMOKE EVAC W/HOLSTER (ELECTROSURGICAL) ×2 IMPLANT
RETRACTOR WND ALEXIS 25 LRG (MISCELLANEOUS) IMPLANT
RTRCTR WOUND ALEXIS 25CM LRG (MISCELLANEOUS)
SPONGE GAUZE 4X4 12PLY STER LF (GAUZE/BANDAGES/DRESSINGS) ×4 IMPLANT
SUT PLAIN 2 0 XLH (SUTURE) ×2 IMPLANT
SUT VIC AB 0 CT1 36 (SUTURE) ×12 IMPLANT
SUT VIC AB 2-0 CT1 27 (SUTURE) ×1
SUT VIC AB 2-0 CT1 TAPERPNT 27 (SUTURE) ×1 IMPLANT
SUT VIC AB 4-0 PS2 27 (SUTURE) ×4 IMPLANT
SYR CONTROL 10ML LL (SYRINGE) ×2 IMPLANT
TOWEL OR 17X24 6PK STRL BLUE (TOWEL DISPOSABLE) ×2 IMPLANT
TRAY FOLEY W/BAG SLVR 14FR LF (SET/KITS/TRAYS/PACK) IMPLANT
WATER STERILE IRR 1000ML POUR (IV SOLUTION) ×2 IMPLANT

## 2021-02-27 NOTE — Discharge Summary (Signed)
Postpartum Discharge Summary     Patient Name: Paige Davidson DOB: 12-Aug-1990 MRN: [id number]  Date of admission: 02/27/2021 Delivery date:02/27/2021  Delivering provider: Wood[place] Mode  Date of discharge: 03/02/2021  Admitting diagnosis: Intrauterine normal pregnancy [Z34.90] Intrauterine pregnancy [Z34.90] Intrauterine pregnancy: [redacted]w[redacted]d    Secondary diagnosis:  Active Problems:   Status post repeat low transverse cesarean section   Intrauterine normal pregnancy   Intrauterine pregnancy  Additional problems History of Myomectomy History of depression  Discharge diagnosis: Term Pregnancy Delivered                                              Post partum procedures: none Complications: None  Hospital course: Sceduled C/S   30y.o. yo [id number] admitted to the hospital 02/27/2021 for scheduled cesarean section with the following indication:Elective Repeat.Delivery details are as follows:  Membrane Rupture Time/Date: 10:31 AM ,02/27/2021   Delivery Method:C-Section, Low Transverse  Details of operation can be found in separate operative note.  Patient had an uncomplicated postpartum course.  She is ambulating, tolerating a regular diet, passing flatus, and urinating well. Patient is discharged home in stable condition on  03/02/21        Newborn Data: Birth date:02/27/2021  Birth time:10:32 AM  Gender:Female  Living status:Living  Apgars:8 ,8  Weight:3265 g     Magnesium Sulfate received: No BMZ received: No Rhophylac:N/A MMR:N/A T-DaP:Given prenatally Flu: N/A Transfusion:No  Physical exam  Vitals:   03/01/21 0532 03/01/21 1512 03/01/21 2030 03/02/21 0500  BP: 124/89 119/78 117/74 110/70  Pulse: 68 67 70 72  Resp: '18 18 18 18  ' Temp: 98 F (36.7 C) 99.3 F (37.4 C) 98.2 F (36.8 C) 98.3 F (36.8 C)  TempSrc: Oral Oral  Oral  SpO2: 100% 100% 100% 100%  Weight:      Height:       General: alert, cooperative, and no distress Lochia:  appropriate Uterine Fundus: firm Incision: Healing well with no significant drainage DVT Evaluation: No evidence of DVT seen on physical exam. Negative Homan's sign. Labs: Lab Results  Component Value Date   WBC 9.9 03/01/2021   HGB 9.9 (L) 03/01/2021   HCT 29.3 (L) 03/01/2021   MCV 89.6 03/01/2021   PLT 263 03/01/2021   CMP Latest Ref Rng & Units 02/23/2021  Glucose 65 - 99 mg/dL 119(H)  BUN 6 - 20 mg/dL 7  Creatinine 0.57 - 1.00 mg/dL 0.63  Sodium 134 - 144 mmol/L 137  Potassium 3.5 - 5.2 mmol/L 3.9  Chloride 96 - 106 mmol/L 101  CO2 20 - 29 mmol/L 19(L)  Calcium 8.7 - 10.2 mg/dL 9.1  Total Protein 6.0 - 8.5 g/dL 6.6  Total Bilirubin 0.0 - 1.2 mg/dL 0.3  Alkaline Phos 44 - 121 IU/L 135(H)  AST 0 - 40 IU/L 8  ALT 0 - 32 IU/L 5   Edinburgh Score: Edinburgh Postnatal Depression Scale Screening Tool 03/01/2021  I have been able to laugh and see the funny side of things. 0  I have looked forward with enjoyment to things. 2  I have blamed myself unnecessarily when things went wrong. 2  I have been anxious or worried for no good reason. 0  I have felt scared or panicky for no good reason. 0  Things have been getting on top of me. 1  I have been so unhappy that I have had difficulty sleeping. 2  I have felt sad or miserable. 2  I have been so unhappy that I have been crying. 1  The thought of harming myself has occurred to me. 0  Edinburgh Postnatal Depression Scale Total 10     After visit meds:  Allergies as of 03/02/2021       Reactions   Penicillins Hives   Did it involve swelling of the face/tongue/throat, SOB, or low BP? No Did it involve sudden or severe rash/hives, skin peeling, or any reaction on the inside of your mouth or nose? Yes Did you need to seek medical attention at a hospital or doctor's office? Unknown When did it last happen?      5+ years If all above answers are "NO", may proceed with cephalosporin use.        Medication List     TAKE these  medications    ibuprofen 600 MG tablet Commonly known as: ADVIL Take 1 tablet (600 mg total) by mouth every 6 (six) hours.   oxyCODONE 5 MG immediate release tablet Commonly known as: Oxy IR/ROXICODONE Take 1-2 tablets (5-10 mg total) by mouth every 4 (four) hours as needed for moderate pain.         Discharge home in stable condition Infant Feeding: Breast Infant Disposition:home with mother Discharge instruction: per After Visit Summary and Postpartum booklet. Activity: Advance as tolerated. Pelvic rest for 6 weeks.  Diet: routine diet Future Appointments: Future Appointments  Date Time Provider [place] [facility]  03/05/2021  1:15 PM [facility] NURSE [facility]  03/30/2021  1:30 PM [name], MD [facility]   Follow up Visit:  [facility] Follow up.   Specialty: Obstetrics and Gynecology Contact information: [address] [place] [phone]               Message sent to [facility] by Renard Matter, MD on 02/27/2021   Please schedule this patient for a In person postpartum visit in 4 weeks with the following provider: Any provider. Additional Postpartum F/U:Incision check 1 week  Low risk pregnancy complicated by:  None Delivery mode:  C-Section, Low Transverse  Anticipated Birth Control:   Vasectomy   03/02/2021 Truett Mainland, DO

## 2021-02-27 NOTE — Lactation Note (Signed)
This note was copied from a baby's chart. Lactation Consultation Note  Patient Name: Paige Davidson [id number] Date: 02/27/2021 Reason for consult: Initial assessment;Early term 37-38.6wks;Other (Comment) (P 2 , Exp BF, baby asleep in the crib, and its been awhile since the baby fed or has been STS. LC changed a wet diaper/ baby more awake /LC placed baby STS on the left breast / football/ and baby latched after several attempts/fed 20 mins/ swallows.) Age:47 hours  Maternal Data Has patient been taught Hand Expression?: Yes Does the patient have breastfeeding experience prior to this delivery?: Yes How long did the patient breastfeed?: per mom with her 1st baby was home with her / did well with breast feeding/ had challenges with pumping in the sense of pumping.  Feeding Mother's Current Feeding Choice: Breast Milk  LATCH Score Latch: Grasps breast easily, tongue down, lips flanged, rhythmical sucking.  Audible Swallowing: A few with stimulation  Type of Nipple: Everted at rest and after stimulation  Comfort (Breast/Nipple): Soft / non-tender  Hold (Positioning): Assistance needed to correctly position infant at breast and maintain latch.  LATCH Score: 8   Lactation Tools Discussed/Used    Interventions Interventions: Breast feeding basics reviewed;Assisted with latch;Skin to skin;Breast massage;Hand express;Breast compression;Adjust position;Support pillows;Position options;Education  Discharge Pump: Personal;DEBP WIC Program: No  Consult Status Consult Status: Follow-up Date: 02/28/21 Follow-up type: In-patient    [place] 02/27/2021, 6:09 PM

## 2021-02-27 NOTE — Op Note (Addendum)
Paige Davidson PROCEDURE DATE: 02/27/2021  PREOPERATIVE DIAGNOSES: Intrauterine pregnancy at 60w0dweeks gestation; previous myomectomy and previous uterine incision KBuddy Dutyx 1  POSTOPERATIVE DIAGNOSES: The same  PROCEDURE: Repeat Low Transverse Cesarean Section  SURGEON:  AWoodroe Mode MD  ASSISTANT:  JJanyth PupaDO  ANESTHESIOLOGY TEAM: Anesthesiologist: BMerlinda Frederick MD CRNA: MHewitt Blade CRNA An experienced assistant was required given the standard of surgical care given the complexity of the case.  This assistant was needed for exposure, dissection, suctioning, retraction, instrument exchange,  assisting with delivery with administration of fundal pressure, and for overall help during the procedure.  This is for cesarean section  INDICATIONS: LKaeli Deforestis a 30y.o. [id number] 336w0dere for cesarean section secondary to the indications listed under preoperative diagnoses; please see preoperative note for further details.  The risks of surgery were discussed with the patient including but were not limited to: bleeding which may require transfusion or reoperation; infection which may require antibiotics; injury to bowel, bladder, ureters or other surrounding organs; injury to the fetus; need for additional procedures including hysterectomy in the event of a life-threatening hemorrhage; formation of adhesions; placental abnormalities wth subsequent pregnancies; incisional problems; thromboembolic phenomenon and other postoperative/anesthesia complications.  The patient concurred with the proposed plan, giving informed written consent for the procedure.    FINDINGS:  Viable female infant in cephalic presentation.  Apgars 8 and 8.  Clear amniotic fluid.  Intact placenta, three vessel cord.  Normal uterus, fallopian tubes and ovaries bilaterally.  ANESTHESIA: Spinall  INTRAVENOUS FLUIDS: 1000 ml   ESTIMATED BLOOD LOSS: 300 ml URINE OUTPUT:  100 ml SPECIMENS:  Placenta sent to L&D COMPLICATIONS: None immediate  PROCEDURE IN DETAIL:  The patient preoperatively received intravenous antibiotics and had sequential compression devices applied to her lower extremities.  She was then taken to the operating room where spinal anesthesia was administered and was found to be adequate. She was then placed in a dorsal supine position with a leftward tilt, and prepped and draped in a sterile manner.  A foley catheter was placed into her bladder and attached to constant gravity.  After an adequate timeout was performed, a Pfannenstiel skin incision was made with scalpel on her preexisting scarand carried through to the underlying layer of fascia. The fascia was incised in the midline, and this incision was extended bilaterally using the Mayo scissors.  Kocher clamps were applied to the superior aspect of the fascial incision and the underlying rectus muscles were dissected off bluntly and sharply.  A similar process was carried out on the inferior aspect of the fascial incision. The rectus muscles were separated in the midline and the peritoneum was entered bluntly. The Alexis self-retaining retractor was introduced into the abdominal cavity.  Attention was turned to the lower uterine segment where a low transverse hysterotomy was made with a scalpel and extended bilaterally bluntly.  The infant was successfully delivered, the cord was clamped and cut after one minute, and the infant was handed over to the awaiting neonatology team. Uterine massage was then administered, and the placenta delivered intact with a three-vessel cord. The uterus was then cleared of clots and debris.  The hysterotomy was closed with 0 Vicryl in a running locked fashion, and an imbricating layer was also placed with 0 Vicryl.  Both Fallopian tubes were traced to the distal ends and Filshie clips were applied 4 cm from the cornua bilaterally with proper position seen.The pelvis was cleared of all  clot and  debris. Hemostasis was confirmed on all surfaces.  The retractor was removed.  The peritoneum was closed with a 2-0 Vicryl . The fascia was then closed using 0 Vicryl.  The subcutaneous layer was irrigated, reapproximated with 2-0 plain gut interrupted stitches, and the skin was closed with a 4-0 Vicryl subcuticular stitch. The patient tolerated the procedure well. Sponge, instrument and needle counts were correct x 3.  She was taken to the recovery room in stable condition.    Woodroe Mode, MD, [place], [facility] for [facility], [place]

## 2021-02-27 NOTE — H&P (Signed)
Paige Davidson is a 30 y.o. female presenting for scheduled repeat C-section at 7 wks for history of prior C-section.   Reports feels well today and does not have any pain or contractions. Denies vaginal bleeding, leaking of fluid, and is feeling baby move. Denies headache, chest pain, shortness of breath.  Reports has had history of depression that she was on anti-depressants for prior to this pregnancy. Considering going back on medications but would like to discuss more with outpatient provider  Planning to breastfeed and considering vasectomy for birth control. 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    History of ectopic pregnancy 10/16/2018   Hypotension    Obesity (BMI 30-39.9) 09/01/2020   Pregnancy with history of uterine myomectomy 05/31/2019   Size > Dates 04/03/2019   '[ ]'$  f/u mfm scan, needs serial growths -9/1: efw 87%, normal ac, afi -9/21:  EFW 65th% AFI WNL   Status post primary low transverse cesarean section 06/02/2019   Hx myomectomy, tx as classical   Supervision of high risk pregnancy, antepartum 11/03/2018   Clinic [facility] Prenatal Labs Dating LMP Blood type: --/--/B POS Performed at [facility], [address] [place], [place] [address]  [phone])  Genetic Screen 1 Screen: Negative         Antibody:Negative (03/09 0952) Anatomic [place] Complete 7/6 Rubella: 3.01 (03/09 [id number]) Varicella: Immune GTT Early:               Third trimester: wnl RPR: Non Reactive (03/09 0952)  Rhogam N/A HBsAg:    Uterine leiomyoma 12/10/2013   Past Surgical History:  Procedure Laterality Date   CESAREAN SECTION N/A  05/31/2019   Procedure: CESAREAN SECTION;  Surgeon: Mora Bellman, MD;  Location: [facility] LD ORS;  Service: Obstetrics;  Laterality: N/A;   CYSTECTOMY     MYOMECTOMY  12/2018   [place], [place] Dr. [name]. Sounds robotic assisted. pt states was told okay to labor   REMOVAL OF IMPLANON ROD  01/31/2020   surgery on left ear     bone removal   Family History: family history includes Breast cancer in her maternal grandmother, paternal aunt, and paternal aunt; Diabetes in her maternal grandfather; Hypertension in her paternal grandfather. Social History:  reports that she has never smoked. She has never used smokeless tobacco. She reports previous alcohol use. She reports that she does not use drugs.     Maternal Diabetes: No Genetic Screening: Declined, normal AFP Maternal Ultrasounds/Referrals: Normal Fetal Ultrasounds or other Referrals:  None Maternal Substance Abuse:  No Significant Maternal Medications:  None Significant Maternal Lab Results:  Group B Strep negative Other Comments:  None  Review of Systems Maternal Medical History:  Reason for admission: Scheduled repeat C-section at [redacted]w[redacted]d Contractions: Frequency: irregular.   Fetal activity: Perceived fetal activity is normal.   Last perceived fetal movement was  within the past hour.   Prenatal complications: No bleeding or PIH.   Prenatal Complications - Diabetes: none.    Blood pressure 119/84, temperature 97.7 F (36.5 C), temperature source Oral, resp. rate 17, height '5\' 6"'$  (1.676 m), weight 112.5 kg, last menstrual period 06/23/2020, unknown if currently breastfeeding. Maternal Exam:  Abdomen: Patient reports no abdominal tenderness. Surgical scars: low transverse.   Introitus: not evaluated.   Cervix: not evaluated.  Physical Exam Vitals reviewed.  HENT:     Head: Normocephalic.  Cardiovascular:     Rate and Rhythm: Normal rate and regular rhythm.     Pulses: Normal pulses.  Pulmonary:     Effort: Pulmonary  effort is normal.  Abdominal:     Comments: Gravid with prior low transverse incision  Skin:    Capillary Refill: Capillary refill takes less than 2 seconds.  Neurological:     General: No focal deficit present.     Mental Status: She is alert.  Psychiatric:        Mood and Affect: Mood normal.    Prenatal labs: ABO, Rh: --/--/B POS (07/20 0947) Antibody: NEG (07/20 0947) Rubella: 3.08 (01/24 1438) RPR: NON REACTIVE (07/20 0943)  HBsAg: Negative (01/24 1438)  HIV: Non Reactive (05/31 0917)  GBS: Negative/-- (06/27 1555)   Assessment/Plan: #Repeat scheduled C-section at 37 weeks Patient with hx of previous myomectomy here for repeat scheduled c-section. Currently no symptoms and tolerating pregnancy.The risks of cesarean section discussed with the patient included but were not limited to: bleeding which may require transfusion or reoperation; infection which may require antibiotics; injury to bowel, bladder, ureters or other surrounding organs; injury to the fetus; need for additional procedures including hysterectomy in the event of a life-threatening hemorrhage; placental abnormalities wth subsequent pregnancies, incisional problems, thromboembolic phenomenon and other postoperative/anesthesia complications. The patient concurred with the proposed plan, giving informed written consent for the procedure.   Patient has been NPO since 1930 on 02/26/2021 and she will remain NPO for procedure. Anesthesia and OR aware. Preoperative prophylactic antibiotics and SCDs ordered on call to the OR.  To OR when ready. - plan to proceed with repeat C-section today  #Hx of depression Patient with hx of depression and post partum depression with previous pregnancy. Not currently on medications. Currently mood is stable. Offered resources, patient would like to discuss further options in post partum visit.   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

## 2021-02-27 NOTE — Lactation Note (Signed)
This note was copied from a baby's chart. Lactation Consultation Note  Patient Name: Paige Davidson [id number] Date: 02/27/2021 Reason for consult: Initial assessment;Early term 37-38.6wks;Other (Comment) (P 2 , Exp BF, baby asleep in the crib, and its been awhile since the baby fed or has been STS. LC changed a wet diaper/ baby more awake /LC placed baby STS on the left breast / football/ and baby latched after several attempts/fed 20 mins/ swallows.) Age:59 hours 1st latch - Latch score - 8  2nd latch on the right breast was an attempt , few strong sucks and released.    LC provided the [facility] brochure with resources .  Maternal Data Has patient been taught Hand Expression?: Yes Does the patient have breastfeeding experience prior to this delivery?: Yes How long did the patient breastfeed?: per mom with her 1st baby was home with her / did well with breast feeding/ had challenges with pumping in the sense of pumping.  Feeding Mother's Current Feeding Choice: Breast Milk  LATCH Score Latch: Grasps breast easily, tongue down, lips flanged, rhythmical sucking.  Audible Swallowing: A few with stimulation  Type of Nipple: Everted at rest and after stimulation  Comfort (Breast/Nipple): Soft / non-tender  Hold (Positioning): Assistance needed to correctly position infant at breast and maintain latch.  LATCH Score: 8   Lactation Tools Discussed/Used    Interventions Interventions: Breast feeding basics reviewed;Assisted with latch;Skin to skin;Breast massage;Hand express;Breast compression;Adjust position;Support pillows;Position options;Education  Discharge Pump: Personal;DEBP WIC Program: No  Consult Status Consult Status: Follow-up Date: 02/28/21 Follow-up type: In-patient    [place] 02/27/2021, 6:07 PM

## 2021-02-27 NOTE — Anesthesia Postprocedure Evaluation (Signed)
Anesthesia Post Note  Patient: Barista  Procedure(s) Performed: [place]     Patient location during evaluation: Mother Baby Anesthesia Type: Spinal Level of consciousness: oriented and awake and alert Pain management: pain level controlled Vital Signs Assessment: post-procedure vital signs reviewed and stable Respiratory status: spontaneous breathing and respiratory function stable Cardiovascular status: blood pressure returned to baseline and stable Postop Assessment: no headache, no backache, no apparent nausea or vomiting and able to ambulate Anesthetic complications: no   No notable events documented.  Last Vitals:  Vitals:   02/27/21 1245 02/27/21 1300  BP: 111/71 119/89  Pulse: 76 74  Resp:  10  Temp:    SpO2:  100%    Last Pain:  Vitals:   02/27/21 1245  TempSrc:   PainSc: 0-No pain   Pain Goal:    LLE Motor Response: Purposeful movement (02/27/21 1245) LLE Sensation: Increased, Tingling (02/27/21 1245) RLE Motor Response: Purposeful movement (02/27/21 1245) RLE Sensation: Increased, Tingling (02/27/21 1245)     Epidural/Spinal Function Cutaneous sensation: Tingles (02/27/21 1245), Patient able to flex knees: No (02/27/21 1245), Patient able to lift hips off bed: No (02/27/21 1245), Back pain beyond tenderness at insertion site: No (02/27/21 1245), Progressively worsening motor and/or sensory loss: No (02/27/21 1245), Bowel and/or bladder incontinence post epidural: No (02/27/21 1245)  Merlinda Frederick

## 2021-02-27 NOTE — Transfer of Care (Signed)
Immediate Anesthesia Transfer of Care Note  Patient: Paige Davidson  Procedure(s) Performed: CESAREAN SECTION  Patient Location: PACU  Anesthesia Type:Spinal  Level of Consciousness: awake, alert  and oriented  Airway & Oxygen Therapy: Patient Spontanous Breathing  Post-op Assessment: Report given to RN and Post -op Vital signs reviewed and stable  Post vital signs: Reviewed and stable  Last Vitals:  Vitals Value Taken Time  BP    Temp    Pulse    Resp    SpO2      Last Pain:  Vitals:   02/27/21 0808  TempSrc: Oral         Complications: No notable events documented.

## 2021-02-28 LAB — CBC
HCT: 27.1 % — ABNORMAL LOW (ref 36.0–46.0)
Hemoglobin: 9.5 g/dL — ABNORMAL LOW (ref 12.0–15.0)
MCH: 31.1 pg (ref 26.0–34.0)
MCHC: 35.1 g/dL (ref 30.0–36.0)
MCV: 88.9 fL (ref 80.0–100.0)
Platelets: 250 10*3/uL (ref 150–400)
RBC: 3.05 MIL/uL — ABNORMAL LOW (ref 3.87–5.11)
RDW: 12.9 % (ref 11.5–15.5)
WBC: 13.6 10*3/uL — ABNORMAL HIGH (ref 4.0–10.5)
nRBC: 0 % (ref 0.0–0.2)

## 2021-02-28 MED ORDER — SODIUM CHLORIDE 0.9 % IV SOLN
500.0000 mg | Freq: Once | INTRAVENOUS | Status: AC
  Administered 2021-02-28: 500 mg via INTRAVENOUS
  Filled 2021-02-28: qty 25

## 2021-02-28 NOTE — Progress Notes (Addendum)
POSTPARTUM PROGRESS NOTE  POD #1  Subjective:  Paige Davidson is a 30 y.o. [id number] s/p RLTCS at [redacted]w[redacted]d  She reports she doing well. No acute events overnight. She reports she is doing well. She denies any problems with ambulating, voiding or po intake. Denies nausea or vomiting. She has passed flatus. Pain is moderately controlled.  Lochia is mild.  Objective: Blood pressure 122/72, pulse 70, temperature 98.2 F (36.8 C), temperature source Oral, resp. rate 18, height '5\' 6"'$  (1.676 m), weight 112.5 kg, last menstrual period 06/23/2020, SpO2 100 %, unknown if currently breastfeeding.  Physical Exam:  General: alert, cooperative and no distress Chest: no respiratory distress Heart:regular rate, distal pulses intact Abdomen: soft, nontender,  Uterine Fundus: firm, appropriately tender DVT Evaluation: No calf swelling or tenderness Extremities: no edema Skin: warm, dry; incision clean/dry/intact w/ pressure dressing in place  Recent Labs    02/25/21 0943 02/28/21 0414  HGB 11.6* 9.5*  HCT 33.9* 27.1*    Assessment/Plan: LZoejane Czechowskiis a 30y.o. [id number]/p RLTCS at 350w0dor scheduled repeat.  POD#1 - Doing welll; pain moderately controlled. H/H appropriate  Routine postpartum care  OOB, ambulated  Lovenox for VTE prophylaxis Anemia: asymptomatic  Start po ferrous sulfate BID Contraception: vasectomy Feeding: breast feeding  Dispo: Plan for discharge POD3.   LOS: 1 day   AnWaldon MerlD Resident Physician 02/28/2021, 7:56 AM  Attestation of Supervision of Student:  I confirm that I have verified the information documented in the  resident  student's note and that I have also personally reperformed the history, physical exam and all medical decision making activities.  I have verified that all services and findings are accurately documented in this student's note; and I agree with management and plan as outlined in the documentation. I have also made any  necessary editorial changes.  Will give IV iron today and continue PO outpatient.  CaWende MottCNArlington Heightsor WoDean Foods CompanyCoPiruroup 02/28/2021 10:38 AM

## 2021-02-28 NOTE — Progress Notes (Signed)
CSW met with MOB to complete consult for history of depression, anxiety, and postpartum depression. CSW observed MOB resting in bed, infant in bassinet, and FOB in recliner. MOB gave CSW verbal consent to complete consult while FOB was present. CSW explained role, and reason for consult. MOB was pleasant, polite, and engaged with CSW. MOB reported, history of depression, and anxiety in approximately 2008. MOB reported, history of Zoloft, and her last dose being around Nov 2021. MOB reported, she has been able to manage her symptoms without medication needed. MOB reported, she is unsure, if she will resume medication once d/c. CSW encourage MOB to implement healthy coping skills when symptoms arises.  MOB reported, history of postpartum depression after her last child (1st born). CSW provided education regarding the baby blues period vs. perinatal mood disorders, discussed treatment and gave resources for mental health follow up if concerns arise. CSW recommends self- evaluation during the postpartum time period using the New Mom Checklist from Postpartum Progress and encouraged MOB to contact a medical professional if symptoms are noted at any time.   When CSW asked MOB of her emotions since delivery. MOB reported, she feels, " anxious, because she has two kids to attend to, and has to worry about going back to work". MOB reported, FOB, mother, and sister are her supports. MOB denied SI, and HI when CSW assessed for safety.   MOB reported, there are no barriers to follow up infant's care. MOB reported, she has all essentials needed to care for infant. MOB reported, infant has a car seat and bassinet. MOB denied any additional barriers.     CSW provided education on sudden infant death syndrome (SIDS).  CSW identifies no further need for intervention or barriers to discharge at this time.  Paige Davidson, MSW, LCSW-A Clinical Social Worker- Weekends [phone]

## 2021-03-01 LAB — CBC WITH DIFFERENTIAL/PLATELET
Abs Immature Granulocytes: 0.18 10*3/uL — ABNORMAL HIGH (ref 0.00–0.07)
Basophils Absolute: 0 10*3/uL (ref 0.0–0.1)
Basophils Relative: 0 %
Eosinophils Absolute: 0.1 10*3/uL (ref 0.0–0.5)
Eosinophils Relative: 1 %
HCT: 29.3 % — ABNORMAL LOW (ref 36.0–46.0)
Hemoglobin: 9.9 g/dL — ABNORMAL LOW (ref 12.0–15.0)
Immature Granulocytes: 2 %
Lymphocytes Relative: 28 %
Lymphs Abs: 2.7 10*3/uL (ref 0.7–4.0)
MCH: 30.3 pg (ref 26.0–34.0)
MCHC: 33.8 g/dL (ref 30.0–36.0)
MCV: 89.6 fL (ref 80.0–100.0)
Monocytes Absolute: 0.5 10*3/uL (ref 0.1–1.0)
Monocytes Relative: 5 %
Neutro Abs: 6.3 10*3/uL (ref 1.7–7.7)
Neutrophils Relative %: 64 %
Platelets: 263 10*3/uL (ref 150–400)
RBC: 3.27 MIL/uL — ABNORMAL LOW (ref 3.87–5.11)
RDW: 13.3 % (ref 11.5–15.5)
WBC: 9.9 10*3/uL (ref 4.0–10.5)
nRBC: 0.3 % — ABNORMAL HIGH (ref 0.0–0.2)

## 2021-03-01 NOTE — Progress Notes (Addendum)
POSTPARTUM PROGRESS NOTE  POD #2  Subjective:  Paige Davidson is a 30 y.o. [id number] s/p RLTCS at [redacted]w[redacted]d  She reports she doing well. No acute events overnight. She denies any problems with ambulating, voiding or po intake. Denies nausea or vomiting. She has passed flatus. Pain is well controlled.  Lochia is mild.  Objective: Blood pressure 124/89, pulse 68, temperature 98 F (36.7 C), temperature source Oral, resp. rate 18, height '5\' 6"'$  (1.676 m), weight 112.5 kg, last menstrual period 06/23/2020, SpO2 100 %, unknown if currently breastfeeding.  Patient Vitals for the past 24 hrs:  BP Temp Temp src Pulse Resp SpO2  03/01/21 0532 124/89 98 F (36.7 C) Oral 68 18 100 %  02/28/21 2345 124/82 98.4 F (36.9 C) Oral 60 18 100 %  02/28/21 1455 118/71 97.9 F (36.6 C) Oral 66 18 --   Physical Exam:  General: alert, cooperative and no distress Chest: no respiratory distress Heart:regular rate, distal pulses intact Abdomen: soft, nontender,  Uterine Fundus: firm, appropriately tender DVT Evaluation: No calf swelling or tenderness Extremities: no edema Skin: warm, dry; incision w/ moderate strikethrough  Recent Labs    02/28/21 0414  HGB 9.5*  HCT 27.1*  CBC ordered s/p venofer.  Assessment/Plan: Paige Davidson a 30y.o. [id number]/p RLTCS at 337w0dor scheduled repeat.  POD#2 - Doing welll; pain moderately controlled. H/H appropriate  Routine postpartum care  OOB, ambulated  Lovenox for VTE prophylaxis Anemia: asymptomatic  Continue po ferrous sulfate BID  S/p IV iron Contraception: vasectomy Feeding: breast feeding  Dispo: Plan for discharge POD3.   LOS: 2 days   AnWaldon MerlD Resident Physician 03/01/2021, 8:50 AM  Attestation of Supervision of Student:  I confirm that I have verified the information documented in the medical resident's note and that I have also personally reperformed the history, physical exam and all medical decision making  activities.  I have verified that all services and findings are accurately documented in this student's note; and I agree with management and plan as outlined in the documentation. I have also made any necessary editorial changes.  NiClarisa FlingNP Center for [facility] CompanyCoTinaroup 03/01/2021 11:33 AM

## 2021-03-01 NOTE — Lactation Note (Signed)
This note was copied from a baby's chart. Lactation Consultation Note  Patient Name: Paige Davidson [id number] Date: 03/01/2021 Reason for consult: Follow-up assessment;Early term 37-38.6wks;Infant weight loss Age:32 hours  In to see mom per request of peds, states mom with a blister to left breast could benefit from assistance with positioning, states baby gained 30g over night. Mom sitting in bed holding sleeping baby, dad asleep on couch. Mom reports noting baby with smacking during feeds, nipple appears flat on release. Mom states planing to return to work at 6weeks, has own DEBP in room, requests fitting of flange.   No visible damage noted to breasts on assessment, mom states blister has healed. Nipples evert bilat, left nipple shorter than right. Encouraged mom to use EBM after feeds, Comfort Gels given with instructions, to be used as needed. Mom fitted for 325m flange. Assisted with latching to right breast, advised have baby skin to skin each feed, advised sandwich breast and use cross cradle vs cradle hold, clicks resolved, multiple audible swallows noted. Advised blister and sub optimal milk transfer may have resulted from shallow latch.  Reinforced feed on cue 8-12 times a day, avoid DEBP and bottles x134monless indicated, recommended pump once a day starting at 25m40mor milk storage, encouraged mom to discuss handling of BM bottles and prep with daycare facility, mom unsure if [facility] has an LC,Hickmandvised telephone and outpatient LC [facility] voiced understanding and with no further concerns. Mom aware to call for lactation if needed prior to next visit. Left the room with baby still latched ~ 38m50mark. BGilliam, RN, IBCLC    Feeding Mother's Current Feeding Choice: Breast Milk  LATCH Score Latch: Grasps breast easily, tongue down, lips flanged, rhythmical sucking.  Audible Swallowing: Spontaneous and intermittent  Type of Nipple: Everted at rest and  after stimulation  Comfort (Breast/Nipple): Filling, red/small blisters or bruises, mild/mod discomfort  Hold (Positioning): Assistance needed to correctly position infant at breast and maintain latch.  LATCH Score: 8     Interventions Interventions: Breast feeding basics reviewed;Assisted with latch;Skin to skin;Breast compression;Adjust position;Position options;Comfort gels   Consult Status Consult Status: Follow-up Date: 03/02/21 Follow-up type: In-patient    BrooBernita Buffy4/2022, 1:30 PM

## 2021-03-02 MED ORDER — IBUPROFEN 600 MG PO TABS: 600.0000 mg | ORAL_TABLET | Freq: Four times a day (QID) | ORAL | 0 refills | Status: AC

## 2021-03-02 MED ORDER — OXYCODONE HCL 5 MG PO TABS: 5.0000 mg | ORAL_TABLET | ORAL | 0 refills | Status: AC | PRN

## 2021-03-02 NOTE — Lactation Note (Signed)
This note was copied from a baby's chart. Lactation Consultation Note  Patient Name: Paige Davidson [id number] Date: 03/02/2021 Reason for consult: Follow-up assessment;Infant weight loss;Early term 37-38.6wks;Other (Comment) (7 % weight loss , milk coming in) Age:31 hours LC changed a wet diaper prior to showing mom the cross cradle position and  Worked on depth at the breast. LC showed mom how to flip the upper lip to increase flange. Increased swallows noted. LC noted areola edema when mom compressed prior to latch - indicating the need for shells between feedings while awake.  Per mom mentioned the left nipple is not noted.  See D/C teaching down below and latch assessment of 8. Per mom the feeding is more comfortable.  Mom has the [facility] brochure with resource numbers.     Maternal Data Has patient been taught Hand Expression?: Yes  Feeding Mother's Current Feeding Choice: Breast Milk and Formula  LATCH Score Latch: Grasps breast easily, tongue down, lips flanged, rhythmical sucking.  Audible Swallowing: Spontaneous and intermittent  Type of Nipple: Everted at rest and after stimulation  Comfort (Breast/Nipple): Filling, red/small blisters or bruises, mild/mod discomfort  Hold (Positioning): Assistance needed to correctly position infant at breast and maintain latch.  LATCH Score: 8   Lactation Tools Discussed/Used Tools: Shells;Pump;Flanges;Coconut oil;Comfort gels Flange Size: 24;27 Breast pump type: Manual Pump Education: Milk Storage;Setup, frequency, and cleaning  Interventions Interventions: Breast feeding basics reviewed;Assisted with latch;Skin to skin;Breast massage;Hand express;Breast compression;Adjust position;Support pillows;Position options;Coconut oil;Shells;Comfort gels;Hand pump;Education  Discharge Discharge Education: Engorgement and breast care;Warning signs for feeding baby Pump: Personal;Manual;DEBP  Consult Status Consult Status: Complete Date:  03/02/21    Myer Haff 03/02/2021, 9:05 AM

## 2021-03-05 ENCOUNTER — Ambulatory Visit (INDEPENDENT_AMBULATORY_CARE_PROVIDER_SITE_OTHER): Payer: 59 | Admitting: *Deleted

## 2021-03-05 ENCOUNTER — Other Ambulatory Visit: Payer: Self-pay

## 2021-03-05 DIAGNOSIS — Z98891 History of uterine scar from previous surgery: Secondary | ICD-10-CM

## 2021-03-05 NOTE — Progress Notes (Signed)
Subjective:     Paige Davidson is a 30 y.o. female who presents to the clinic 1 weeks status post low uterine, transverse cesarean section. Pt reports incision was bleeding from an area and so she removed honeycomb last night and placed a band aid on the area.      Objective:    BP 126/82   Pulse 80   Wt 245 lb (111.1 kg)   BMI 39.54 kg/m  General:  alert, well appearing, in no apparent distress  Incision:   healing well, no drainage, no erythema, no hernia, no seroma, no swelling, no dehiscence, incision well approximated     Assessment:    Doing well postoperatively.   Plan:    1. Continue any current medications. 2. Wound care discussed. 3. Follow up: at postpartum visit and or as needed.   Crosby Oyster, RN

## 2021-03-06 NOTE — Telephone Encounter (Signed)
We can try some flexeril

## 2021-03-09 NOTE — Progress Notes (Signed)
Patient was assessed and managed by nursing staff during this encounter. I have reviewed the chart and agree with the documentation and plan. I have also made any necessary editorial changes.  [name], MD 03/09/2021 9:52 AM

## 2021-03-11 ENCOUNTER — Telehealth (HOSPITAL_COMMUNITY): Payer: Self-pay | Admitting: *Deleted

## 2021-03-11 DIAGNOSIS — Z1331 Encounter for screening for depression: Secondary | ICD-10-CM

## 2021-03-11 NOTE — Telephone Encounter (Signed)
Patient voiced no questions or concerns regarding her physical health at this time. EPDS = 15. Patient denied thoughts of harming herself. RN informed patient that she would submit a referral for an appointment with Gila - patient verbalized understanding. Referral to integrated behavioral health submitted - Dr. Nelda Marseille notified.Patient voiced no questions or concerns regarding baby at this time. Reported infant sleeps in her bassinet. Stated infant often rolls to her side, but said, "I put her back on her back." RN reviewed ABCs of safe sleep with patient - patient verbalized understanding. Patient requested RN email mental health resources and information regarding the hospital's postpartum classes and support groups. Email sent. Erline Levine, RN 03/11/21, [phone]

## 2021-03-12 ENCOUNTER — Telehealth: Payer: Self-pay | Admitting: Clinical

## 2021-03-12 NOTE — Telephone Encounter (Signed)
Attempt to contact pt to schedule, per referral; Left HIPPA-compliant message to call back Roselyn Reef from [employer] for [employer] at [facility] for Women at  [phone] [facility] office).

## 2021-03-30 ENCOUNTER — Ambulatory Visit (INDEPENDENT_AMBULATORY_CARE_PROVIDER_SITE_OTHER): Payer: 59 | Admitting: Obstetrics and Gynecology

## 2021-03-30 ENCOUNTER — Other Ambulatory Visit: Payer: Self-pay

## 2021-03-30 VITALS — BP 123/84 | HR 82 | Ht 66.0 in | Wt 227.2 lb

## 2021-03-30 DIAGNOSIS — O99345 Other mental disorders complicating the puerperium: Secondary | ICD-10-CM

## 2021-03-30 DIAGNOSIS — Z98891 History of uterine scar from previous surgery: Secondary | ICD-10-CM

## 2021-03-30 DIAGNOSIS — F53 Postpartum depression: Secondary | ICD-10-CM
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
## 2021-04-01 ENCOUNTER — Ambulatory Visit: Payer: 59 | Admitting: Clinical

## 2021-04-01 DIAGNOSIS — F4323 Adjustment disorder with mixed anxiety and depressed mood: Secondary | ICD-10-CM

## 2021-04-01 NOTE — Patient Instructions (Signed)
Center for [facility] at [facility] for Women [place], [place] [address] [phone] (main office) [phone] ([place] office)  [email or web address] (new mom support groups online)

## 2021-04-01 NOTE — BH Specialist Note (Signed)
Integrated Behavioral Health via Telemedicine Visit  04/01/2021 Paige Davidson [id number]  Number of [place] visits: 1 Session Start time: 3:22 Session End time: 3:40 Total time:  18  Referring Provider: [name], MD Patient/Family location: Home [facility] Provider location: Center for [facility] at [facility] for [facility]  All persons participating in visit: Patient Paige Davidson and Paige Davidson   Types of Service: Individual psychotherapy and Video visit  I connected with Paige Davidson and/or Paige Davidson's  n/a  via  Telephone or Geologist, engineering  (Video is Caregility application) and verified that I am speaking with the correct person using two identifiers. Discussed confidentiality: Yes   I discussed the limitations of telemedicine and the availability of in person appointments.  Discussed there is a possibility of technology failure and discussed alternative modes of communication if that failure occurs.  I discussed that engaging in this telemedicine visit, they consent to the provision of behavioral healthcare and the services will be billed under their insurance.  Patient and/or legal guardian expressed understanding and consented to Telemedicine visit: Yes   Presenting Concerns: Patient and/or family reports the following symptoms/concerns: Pt states her primary concern is feeling overwhelmed and isolated with no friends with children, and worry about adjusting to going back to work; her mom and sister have been supportive Duration of problem: Postpartum; Severity of problem: severe  Patient and/or Family's Strengths/Protective Factors: Social connections and Sense of purpose  Goals Addressed: Patient will:  Reduce symptoms of: anxiety, depression, and stress   Increase knowledge and/or ability of: healthy habits   Demonstrate ability to: Increase healthy adjustment to current  life circumstances and Increase adequate support systems for patient/family  Progress towards Goals: Ongoing  Interventions: Interventions utilized:  Psychoeducation and/or Health Education, Link to [employer], and Supportive Reflection Standardized Assessments completed: Paige Davidson Postnatal Depression  Patient and/or Family Response: Pt agrees with treatment plan  Assessment: Patient currently experiencing Adjustment disorder with mixed anxious and depressed mood.   Patient may benefit from psychoeducation and brief therapeutic interventions regarding coping with symptoms of depression, anxiety.  Plan: Follow up with behavioral health clinician on : Two weeks Behavioral recommendations:  -Continue prioritizing healthy sleeping and eating as much as able the next two weeks -Consider registering for new mom online support group of choice at [email or web address] within next two weeks Referral(s): [facility] (In Clinic) and [employer]:  new mom support  I discussed the assessment and treatment plan with the patient and/or parent/guardian. They were provided an opportunity to ask questions and all were answered. They agreed with the plan and demonstrated an understanding of the instructions.   They were advised to call back or seek an in-person evaluation if the symptoms worsen or if the condition fails to improve as anticipated.  Paige Fair, LCSW  Depression screen [facility] 2/9 10/15/2019 08/01/2019 07/24/2019  Decreased Interest 0 1 3  Down, Depressed, Hopeless '2 3 2  '$ PHQ - 2 Score '2 4 5  '$ Altered sleeping '2 2 1  '$ Tired, decreased energy '2 2 3  '$ Change in appetite 2 0 3  Feeling bad or failure about yourself  '3 2 1  '$ Trouble concentrating '3 1 3  '$ Moving slowly or fidgety/restless 0 0 0  Suicidal thoughts 0 0 1  PHQ-9 Score '14 11 17    '$ Edinburgh Postnatal Depression Scale Screening Tool 03/30/2021 03/11/2021 03/01/2021 07/04/2019 06/02/2019  I  have been able to laugh  and see the funny side of things. 2 1 0 0 0  I have looked forward with enjoyment to things. '3 3 2 1 '$ 0  I have blamed myself unnecessarily when things went wrong. '3 2 2 1 1  '$ I have been anxious or worried for no good reason. 3 3 0 2 2  I have felt scared or panicky for no good reason. 3 2 0 2 0  Things have been getting on top of me. '3 1 1 1 '$ 0  I have been so unhappy that I have had difficulty sleeping. 0 0 2 0 0  I have felt sad or miserable. '2 2 2 1 '$ 0  I have been so unhappy that I have been crying. '3 1 1 1 1  '$ The thought of harming myself has occurred to me. 1 0 0 0 0  Edinburgh Postnatal Depression Scale Total '23 15 10 9 '$ 4

## 2021-04-06 NOTE — BH Specialist Note (Deleted)
Integrated Behavioral Health via Telemedicine Visit  04/06/2021 Paige Davidson [id number]  Number of Integrated Behavioral Health visits: *** Session Start time: 2:15***  Session End time: 2:45*** Total time: {IBH Total [id number]  Referring Provider: *** Patient/Family location: Home*** [facility] Provider location: Center for [place] at [facility] for [facility]  All persons participating in visit: Patient *** and Paige Davidson ***  Types of Service: {CHL AMB TYPE OF SERVICE:[phone]}  I connected with Paige Davidson and/or Paige Davidson's {family members:20773} via  Telephone or Geologist, engineering  (Video is Tree surgeon) and verified that I am speaking with the correct person using two identifiers. Discussed confidentiality: {YES/NO:21197}  I discussed the limitations of telemedicine and the availability of in person appointments.  Discussed there is a possibility of technology failure and discussed alternative modes of communication if that failure occurs.  I discussed that engaging in this telemedicine visit, they consent to the provision of behavioral healthcare and the services will be billed under their insurance.  Patient and/or legal guardian expressed understanding and consented to Telemedicine visit: {YES/NO:21197}  Presenting Concerns: Patient and/or family reports the following symptoms/concerns: *** Duration of problem: ***; Severity of problem: {Mild/Moderate/Severe:20260}  Patient and/or Family's Strengths/Protective Factors: {CHL AMB BH PROTECTIVE FACTORS:[phone]}  Goals Addressed: Patient will:  Reduce symptoms of: {IBH Symptoms:21014056}   Increase knowledge and/or ability of: {IBH Patient Tools:21014057}   Demonstrate ability to: {IBH Goals:21014053}  Progress towards Goals: {CHL AMB BH PROGRESS TOWARDS GOALS:[phone]}  Interventions: Interventions utilized:  {IBH  Interventions:21014054} Standardized Assessments completed: {IBH Screening Tools:21014051}  Patient and/or Family Response: ***  Assessment: Patient currently experiencing ***.   Patient may benefit from ***.  Plan: Follow up with behavioral health clinician on : *** Behavioral recommendations: *** Referral(s): {IBH Referrals:21014055}  I discussed the assessment and treatment plan with the patient and/or parent/guardian. They were provided an opportunity to ask questions and all were answered. They agreed with the plan and demonstrated an understanding of the instructions.   They were advised to call back or seek an in-person evaluation if the symptoms worsen or if the condition fails to improve as anticipated.  Paige Davidson Ulah Olmo, LCSW

## 2021-10-31 IMAGING — US US MFM OB FOLLOW-UP
1 series · 13 of 28 positions shown · non-contrast
Comparison: none

[Series 1: us mfm ob follow-up · 13 of 63 slices shown]
[im 3/63]
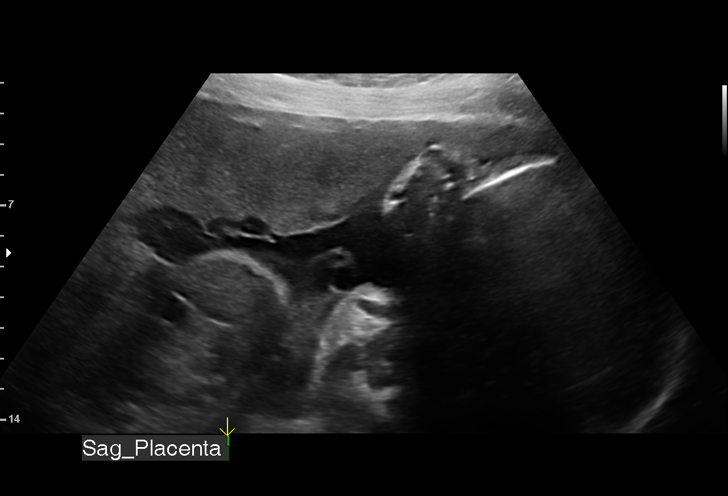
[im 7/63]
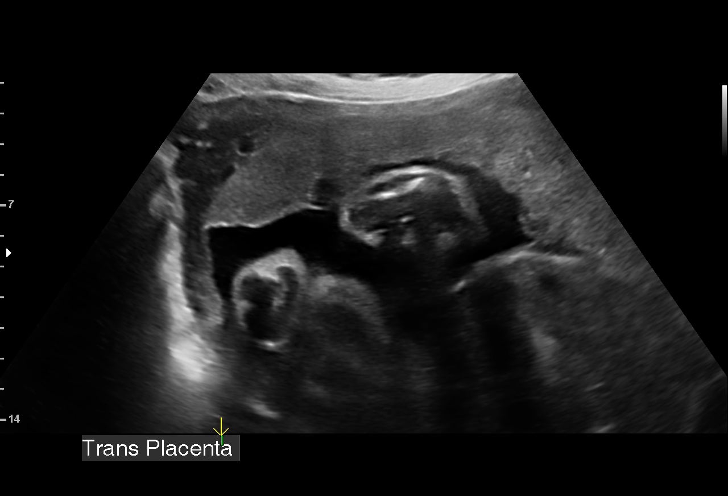
[im 12/63]
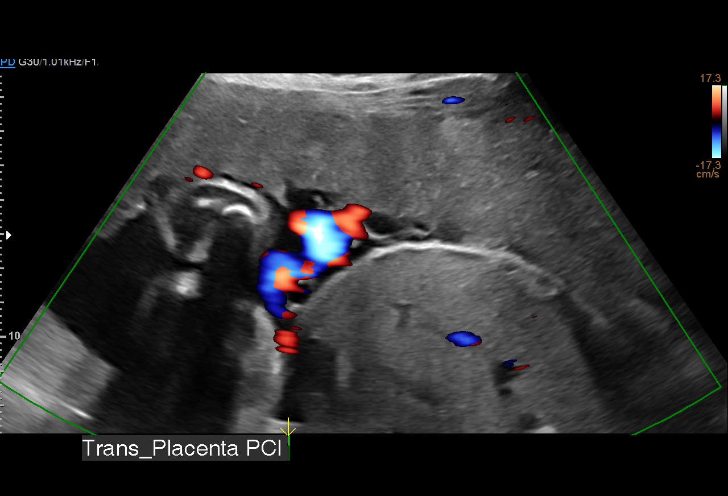
[im 17/63]
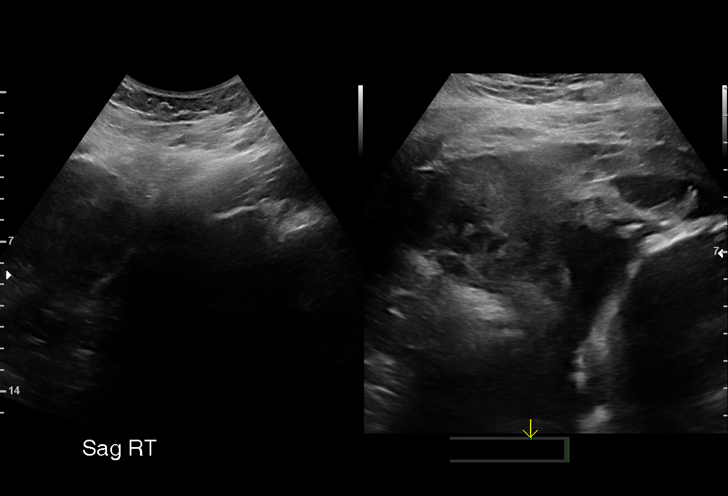
[im 21/63]
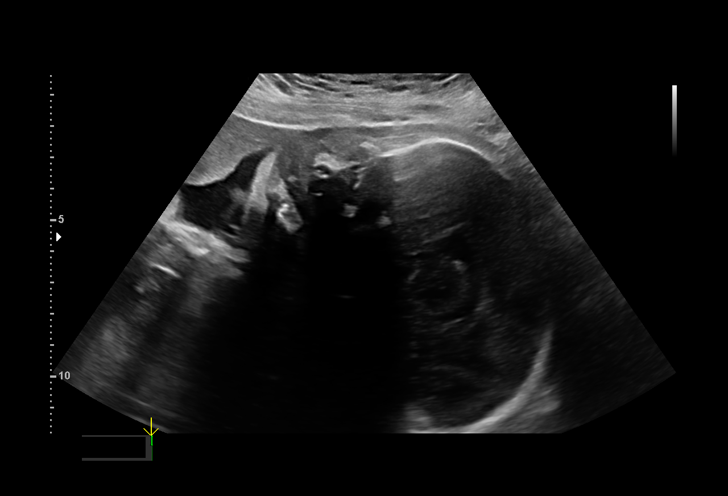
[im 26/63]
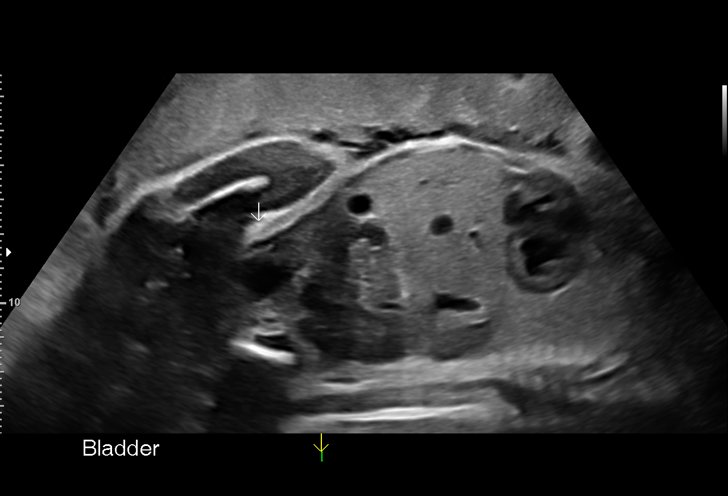
[im 33/63]
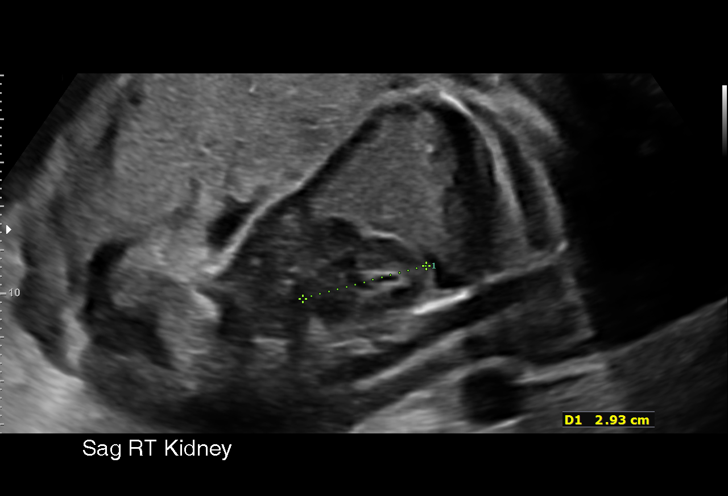
[im 37/63]
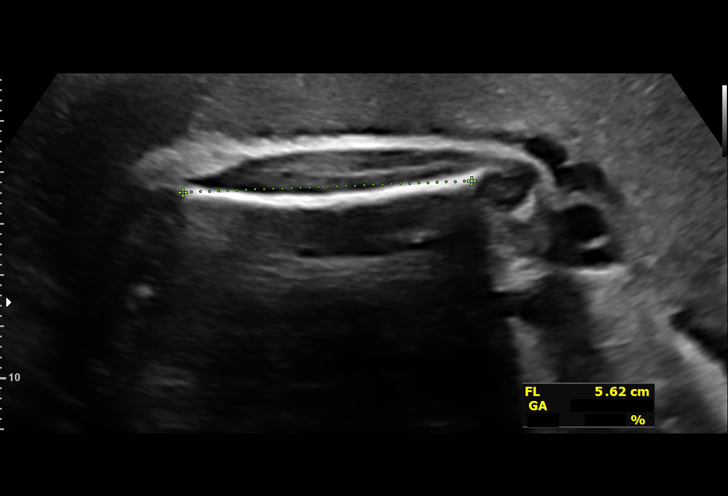
[im 42/63]
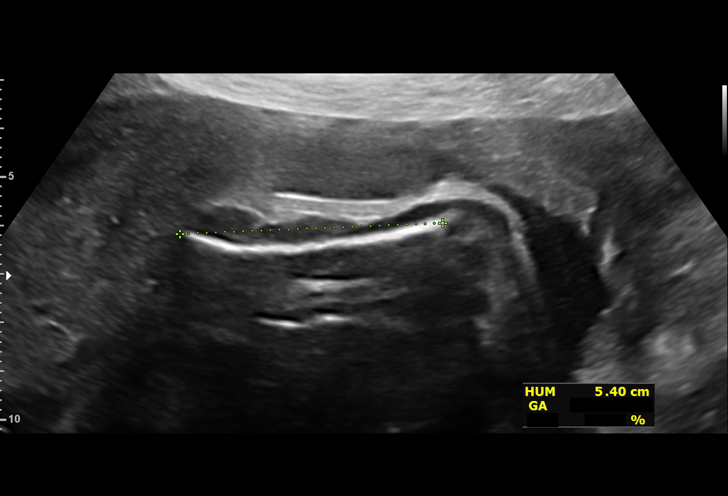
[im 46/63]
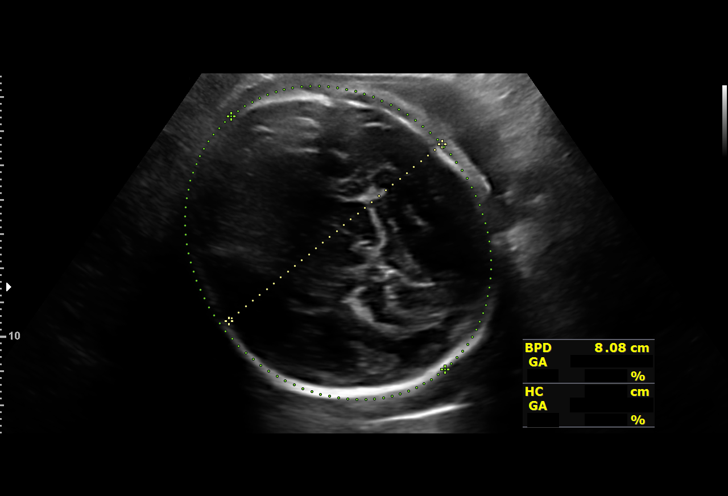
[im 51/63]
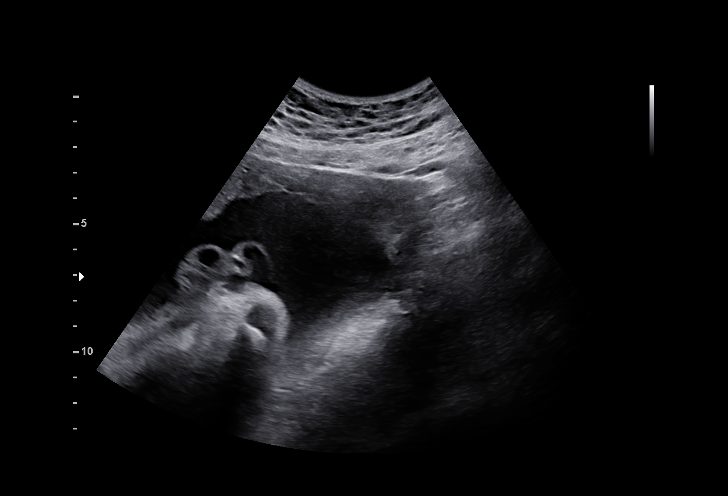
[im 56/63]
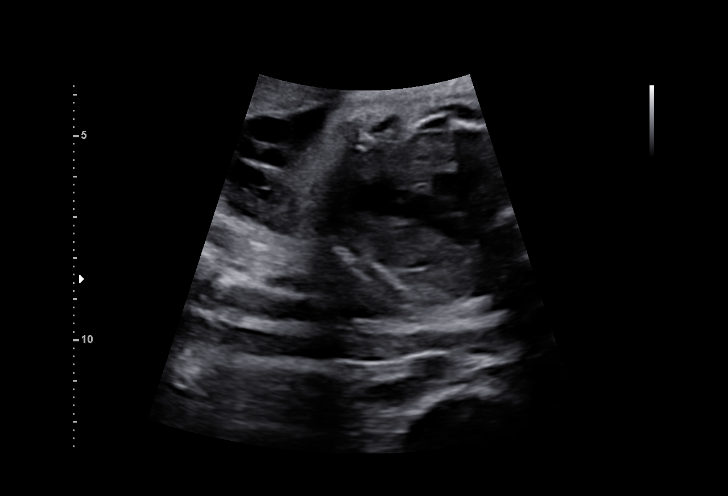
[im 60/63]
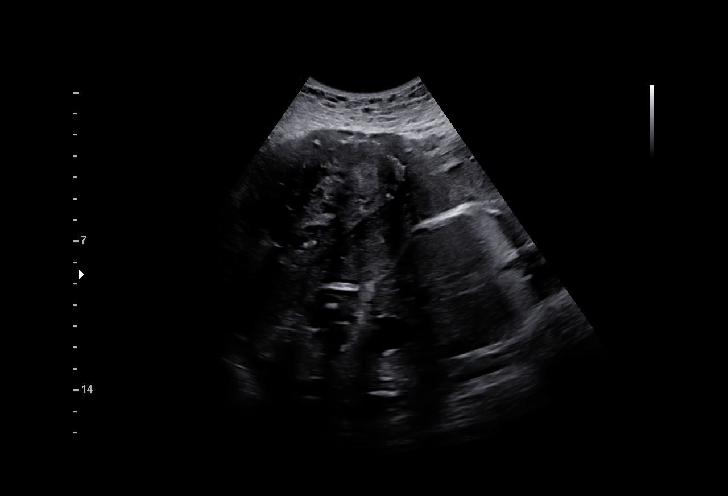

[13 of 28 positions shown; findings below may reference images not displayed]

TAT SHING

Indications

 Uterine fibroids affecting pregnancy in third  O34.13,
 trimester, antepartum
 Obesity complicating pregnancy, third
 trimester
 History of cesarean delivery, currently
 pregnant
 Hx Myomectomy
 Hx of ectopic pregnancy
 Declined Gen. Testing. AFP - neg.
 30 weeks gestation of pregnancy
Fetal Evaluation

 Num Of Fetuses:         1
 Fetal Heart Rate(bpm):  150
 Cardiac Activity:       Observed
 Presentation:           Cephalic
 Placenta:               Anterior Fundal
 P. Cord Insertion:      Visualized, central

 Amniotic Fluid
 AFI FV:      Within normal limits

 AFI Sum(cm)     %Tile       Largest Pocket(cm)
 9.13            6

 RUQ(cm)       RLQ(cm)       LUQ(cm)        LLQ(cm)

Biometry

 BPD:      82.6  mm     G. Age:  33w 2d         99  %    CI:        82.49   %    70 - 86
                                                         FL/HC:      19.7   %    19.2 -
 HC:      286.9  mm     G. Age:  31w 4d         60  %    HC/AC:      1.13        0.99 -
 AC:      254.6  mm     G. Age:  29w 5d         34  %    FL/BPD:     68.4   %    71 - 87
 FL:       56.5  mm     G. Age:  29w 5d         26  %    FL/AC:      22.2   %    20 - 24
 HUM:      53.2  mm     G. Age:  31w 0d         70  %
 LV:        3.2  mm

 Est. FW:    8487  gm      3 lb 5 oz     40  %
OB History

 Gravidity:    3         Term:   1
 Ectopic:      1
Gestational Age

 LMP:           28w 4d        Date:  06/23/20                 EDD:   03/30/21
 U/S Today:     31w 1d                                        EDD:   03/12/21
 Best:          30w 0d     Det. By:  Early Ultrasound         EDD:   03/20/21
                                     (09/01/20)
Anatomy

 Cranium:               Appears normal         LVOT:                   Previously seen
 Cavum:                 Previously seen        Aortic Arch:            Previously seen
 Ventricles:            Appears normal         Ductal Arch:            Previously seen
 Choroid Plexus:        Previously seen        Diaphragm:              Appears normal
 Cerebellum:            Previously seen        Stomach:                Appears normal, left
                                                                       sided
 Posterior Fossa:       Previously seen        Abdomen:                Appears normal
 Nuchal Fold:           Previously seen        Abdominal Wall:         Previously seen
 Face:                  Orbits and profile     Cord Vessels:           Previously seen
                        previously seen
 Lips:                  Previously seen        Kidneys:                Appear normal
 Palate:                Not well visualized    Bladder:                Appears normal
 Thoracic:              Previously seen        Spine:                  Previously seen
 Heart:                 Previously seen        Upper Extremities:      Previously seen
 RVOT:                  Previously seen        Lower Extremities:      Previously seen

 Other:  Fetus appears to be female. Nasal bone prev visualized. Heels/feet
         and open hands/5th digits prev visualized. VC, 3VV and 3VTV prev
         visualized. Technically difficult due to maternal habitus.
Cervix Uterus Adnexa

 Cervix
 Not visualized (advanced GA >73wks)

 Uterus
 Single fibroid noted, see table below.

 Right Ovary
 Not visualized.
 Left Ovary
 Not visualized.

 Cul De Sac
 No free fluid seen.

 Adnexa
 No adnexal mass visualized.

 Comment
 Fibroid in left adnexa not well visualized.
Myomas

 Site                     L(cm)      W(cm)      D(cm)       Location
 Left

 Blood Flow                  RI       PI       Comments

Comments

 This patient was seen for a follow up growth scan due to due
 to a fibroid uterus.  She denies any problems since her last
 exam.
 She was informed that the fetal growth and amniotic fluid
 level appears appropriate for her gestational age.
 The 10 cm left sided fibroid continues to be noted on today's
 exam.
 Due to her fibroid uterus, a follow-up growth scan was
 scheduled in 4 weeks.

## 2021-11-14 ENCOUNTER — Other Ambulatory Visit: Payer: Self-pay

## 2021-11-14 ENCOUNTER — Emergency Department: Payer: 59

## 2021-11-14 ENCOUNTER — Emergency Department
Admission: EM | Admit: 2021-11-14 | Discharge: 2021-11-14 | Disposition: A | Discharge status: Home / Self Care | Payer: 59 | Attending: Emergency Medicine | Admitting: Emergency Medicine

## 2021-11-14 DIAGNOSIS — R1030 Lower abdominal pain, unspecified: Secondary | ICD-10-CM | POA: Diagnosis not present

## 2021-11-14 DIAGNOSIS — D259 Leiomyoma of uterus, unspecified: Secondary | ICD-10-CM | POA: Diagnosis not present

## 2021-11-14 DIAGNOSIS — Y92410 Unspecified street and highway as the place of occurrence of the external cause: Secondary | ICD-10-CM | POA: Diagnosis not present

## 2021-11-14 DIAGNOSIS — R188 Other ascites: Secondary | ICD-10-CM | POA: Insufficient documentation

## 2021-11-14 LAB — COMPREHENSIVE METABOLIC PANEL
ALT: 16 U/L (ref 0–44)
AST: 18 U/L (ref 15–41)
Albumin: 4.3 g/dL (ref 3.5–5.0)
Alkaline Phosphatase: 43 U/L (ref 38–126)
Anion gap: 8 (ref 5–15)
BUN: 9 mg/dL (ref 6–20)
CO2: 25 mmol/L (ref 22–32)
Calcium: 8.7 mg/dL — ABNORMAL LOW (ref 8.9–10.3)
Chloride: 107 mmol/L (ref 98–111)
Creatinine, Ser: 0.84 mg/dL (ref 0.44–1.00)
GFR, Estimated: >60 mL/min (ref >60)
Glucose, Bld: 92 mg/dL (ref 70–99)
Potassium: 3.4 mmol/L — ABNORMAL LOW (ref 3.5–5.1)
Sodium: 140 mmol/L (ref 135–145)
Total Bilirubin: 0.4 mg/dL (ref 0.3–1.2)
Total Protein: 7.7 g/dL (ref 6.5–8.1)

## 2021-11-14 LAB — CBC WITH DIFFERENTIAL/PLATELET
Abs Immature Granulocytes: 0.02 10*3/uL (ref 0.00–0.07)
Basophils Absolute: 0 10*3/uL (ref 0.0–0.1)
Basophils Relative: 1 %
Eosinophils Absolute: 0.1 10*3/uL (ref 0.0–0.5)
Eosinophils Relative: 1 %
HCT: 39.1 % (ref 36.0–46.0)
Hemoglobin: 13.1 g/dL (ref 12.0–15.0)
Immature Granulocytes: 0 %
Lymphocytes Relative: 38 %
Lymphs Abs: 3.3 10*3/uL (ref 0.7–4.0)
MCH: 31.2 pg (ref 26.0–34.0)
MCHC: 33.5 g/dL (ref 30.0–36.0)
MCV: 93.1 fL (ref 80.0–100.0)
Monocytes Absolute: 0.5 10*3/uL (ref 0.1–1.0)
Monocytes Relative: 5 %
Neutro Abs: 4.9 10*3/uL (ref 1.7–7.7)
Neutrophils Relative %: 55 %
Platelets: 288 10*3/uL (ref 150–400)
RBC: 4.2 MIL/uL (ref 3.87–5.11)
RDW: 13 % (ref 11.5–15.5)
WBC: 8.8 10*3/uL (ref 4.0–10.5)
nRBC: 0 % (ref 0.0–0.2)

## 2021-11-14 LAB — LIPASE, BLOOD: Lipase: 33 U/L (ref 11–51)

## 2021-11-14 LAB — URINALYSIS, ROUTINE W REFLEX MICROSCOPIC
Bilirubin Urine: NEGATIVE
Glucose, UA: NEGATIVE mg/dL
Ketones, ur: NEGATIVE mg/dL
Leukocytes,Ua: NEGATIVE
Nitrite: NEGATIVE
Protein, ur: NEGATIVE mg/dL
Specific Gravity, Urine: 1.019 (ref 1.005–1.030)
pH: 5 (ref 5.0–8.0)

## 2021-11-14 LAB — POC URINE PREG, ED: Preg Test, Ur: NEGATIVE

## 2021-11-14 MED ORDER — IBUPROFEN 600 MG PO TABS
600.0000 mg | ORAL_TABLET | Freq: Once | ORAL | Status: AC
  Administered 2021-11-14: 600 mg via ORAL
  Filled 2021-11-14: qty 1

## 2021-11-14 MED ORDER — IOHEXOL 300 MG/ML  SOLN
100.0000 mL | Freq: Once | INTRAMUSCULAR | Status: AC | PRN
  Administered 2021-11-14: 100 mL via INTRAVENOUS

## 2021-11-14 MED ORDER — SERTRALINE HCL 25 MG PO TABS: 25.0000 mg | ORAL_TABLET | Freq: Every day | ORAL | 0 refills | Status: AC

## 2021-11-14 MED ORDER — SERTRALINE HCL 50 MG PO TABS
25.0000 mg | ORAL_TABLET | Freq: Every day | ORAL | Status: DC
  Administered 2021-11-14: 25 mg via ORAL
  Filled 2021-11-14: qty 1

## 2021-11-14 NOTE — BH Assessment (Signed)
Writer provided the patient with information and instructions on how to access [facility] ([facility]). Also provided the patient with the contact information for [facility] Peer Support staff member Lanae Boast B.).  ? ?Patient denies SI/HI and AV/H. ? ? ?________________________ ?[facility] ?[address],  ?[place], [place] [address] ?([phone] ?

## 2021-11-14 NOTE — ED Notes (Signed)
Per Dr. Joni Fears, pt may have cup of water. Pt provided with cup of ice water, instructed to only take a few sips.  ?

## 2021-11-14 NOTE — ED Notes (Signed)
Pt provided with cup of ice water.  ?
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
## 2021-11-14 NOTE — ED Notes (Signed)
Pt transported to CT via stretcher at this time.  

## 2021-11-14 NOTE — BH Assessment (Signed)
Comprehensive Clinical Assessment (CCA) Screening, Triage and Referral Note ? ?11/14/2021 ?Paige Davidson ?[id number] ? ?Chief Complaint:  ?Chief Complaint  ?Patient presents with  ? Abdominal Pain  ? ?Visit Diagnosis: Depression ? ?Paige Davidson. Single is 31 year old female who presents to the ER due to stomach pain, but while here she voiced her concerned for the depression and recent relapse on alcohol. She having increased crying spells, feeling overwhelmed and low energy. ? ?During the interview the patient was calm, cooperative and pleasant. She was able to provide appropriate answers to the questions.She denies SI/HI and AV/H. ? ?Patient Reported Information ?How did you hear about [place]? Self ? ?What Is the Reason for Your Visit/Call Today? Voicing symptoms of depression ? ?How Long Has This Been Causing You Problems? 1 wk - 1 month ? ?What Do You Feel Would Help You the Most Today? Treatment for Depression or other mood problem; Alcohol or Drug Use Treatment ? ? ?Have You Recently Had Any Thoughts About Hurting Yourself? No ? ?Are You Planning to Commit Suicide/Harm Yourself At This time? No ? ? ?Have you Recently Had Thoughts About [place]? No ? ?Are You Planning to Harm Someone at This Time? No ? ?Explanation: No data recorded ? ?Have You Used Any Alcohol or Drugs in the Past 24 Hours? Yes ? ?How Long Ago Did You Use Drugs or Alcohol? No data recorded ?What Did You Use and How Much? Alcohol ? ? ?Do You Currently Have a Therapist/Psychiatrist? No ? ?Name of Therapist/Psychiatrist: No data recorded ? ?Have You Been Recently Discharged From Any Office Practice or Programs? No ? ?Explanation of Discharge From Practice/Program: No data recorded ?  ?CCA Screening Triage Referral Assessment ?Type of Contact: Face-to-Face ? ?Telemedicine Service Delivery:   ?Is this Initial or Reassessment? No data recorded ?Date Telepsych consult ordered in CHL:  No data recorded ?Time Telepsych consult ordered in CHL:   No data recorded ?Location of Assessment: [facility] ED ? ?Provider Location: [facility] ED ? ? ?Collateral Involvement: No data recorded ? ?Does Patient Have a Stage manager Guardian? No data recorded ?Name and Contact of Legal Guardian: No data recorded ?If Minor and Not Living with Parent(s), Who has Custody? No data recorded ?Is CPS involved or ever been involved? Never ? ?Is APS involved or ever been involved? Never ? ? ?Patient Determined To Be At Risk for Harm To Self or Others Based on Review of Patient Reported Information or Presenting Complaint? No ? ?Method: No data recorded ?Availability of Means: No data recorded ?Intent: No data recorded ?Notification Required: No data recorded ?Additional Information for Danger to Others Potential: No data recorded ?Additional Comments for Danger to Others Potential: No data recorded ?Are There Guns or Other Weapons in [place]? No data recorded ?Types of Guns/Weapons: No data recorded ?Are These Weapons Safely Secured?                            No data recorded ?Who Could Verify You Are Able To Have These Secured: No data recorded ?Do You Have any Outstanding Charges, Pending Court Dates, Parole/Probation? No data recorded ?Contacted To Inform of Risk of Harm To Self or Others: No data recorded ? ?Does Patient Present under Involuntary Commitment? Yes ? ?IVC Papers Initial File Date: 11/14/21 ? ? ?[place] of Residence: No data recorded ? ?Patient Currently Receiving the Following Services: No data recorded ? ?Determination of Need: Emergent (2 hours) ? ? ?  Options For Referral: ED Visit ? ? ?Discharge Disposition:  ?  ?Paige Fusi MS, LCAS, [facility] [place] ?Therapeutic Triage Specialist ?11/14/2021 2:38 PM ? ?

## 2021-11-14 NOTE — ED Notes (Signed)
AVS with prescriptions provided to and discussed with patient and family member at bedside. Pt verbalizes understanding of discharge instructions and denies any questions or concerns at this time. Pt has ride home. Pt ambulated out of department independently with steady gait. Pt given resources for shelters, behavorial health resources, etc.  ? ?

## 2021-11-14 NOTE — ED Notes (Signed)
Pt called out requesting ibuprofen for pain. Secure chat sent to MD regarding same.  ?

## 2021-11-14 NOTE — Discharge Instructions (Addendum)
Your labs and CT scan were all okay today.  The urine test does not show any blood in your urine currently.  The CT scan does not show any serious traumatic injuries, but does incidentally note a uterine fibroid.  If you are having severely painful menstrual cycles or heavy/irregular bleeding, please follow-up with gynecology for further evaluation. ?
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
## 2022-01-18 ENCOUNTER — Other Ambulatory Visit (HOSPITAL_COMMUNITY): Payer: Self-pay | Admitting: Psychiatry

## 2022-04-08 ENCOUNTER — Ambulatory Visit
Admission: EM | Admit: 2022-04-08 | Discharge: 2022-04-08 | Disposition: A | Discharge status: Home / Self Care | Payer: 59 | Attending: Emergency Medicine | Admitting: Emergency Medicine

## 2022-04-08 DIAGNOSIS — S61531A Puncture wound without foreign body of right wrist, initial encounter: Secondary | ICD-10-CM

## 2022-04-08 DIAGNOSIS — L03113 Cellulitis of right upper limb: Secondary | ICD-10-CM

## 2022-04-08 MED ORDER — SULFAMETHOXAZOLE-TRIMETHOPRIM 800-160 MG PO TABS: 1.0000 | ORAL_TABLET | Freq: Two times a day (BID) | ORAL | 0 refills | Status: AC

## 2022-04-08 NOTE — Discharge Instructions (Addendum)
Keep your wound clean and dry.  Wash it gently twice a day with soap and water.  Apply an antibiotic cream twice a day.    Take the Bactrim as directed.   Go to the emergency department if you see signs of worsening infection, such as redness, pus-like drainage, warmth, fever, chills, or other concerning symptoms.

## 2022-04-08 NOTE — ED Provider Notes (Signed)
Paige Davidson    CSN: [id number] Arrival date & time: 04/08/22  0847      History   Chief Complaint Chief Complaint  Patient presents with   Puncture Wound    HPI Paige Davidson is a 31 y.o. female.  Patient presents with a puncture wound on her right wrist that occurred yesterday while she was cooking.  She accidentally stabbed her wrist with a cooking knife.  The area has become swollen and tender today.  No fever, chills, wound drainage, numbness, weakness, paresthesias, or other symptoms.  Treatment at home with peroxide.  Last tetanus 2 years ago.  She denies current pregnancy or breastfeeding.     The history is provided by the patient and medical records.    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    History of ectopic pregnancy 10/16/2018   Hypotension    Obesity (BMI 30-39.9) 09/01/2020   Pregnancy with history of uterine myomectomy 05/31/2019   Size > Dates 04/03/2019   '[ ]'$  f/u mfm scan, needs serial growths -9/1: efw 87%, normal ac, afi -9/21:  EFW 65th% AFI WNL   Status post primary low transverse cesarean section 06/02/2019   Hx myomectomy, tx as classical   Supervision of high risk pregnancy, antepartum 11/03/2018   Clinic [facility] Prenatal Labs Dating LMP Blood type: --/--/B POS Performed at [facility], [address], [place], [place] [address]  [phone])  Genetic Screen 1 Screen: Negative         Antibody:Negative (03/09 0952) Anatomic [place] Complete 7/6 Rubella: 3.01 (03/09 6195) Varicella: Immune GTT Early:               Third trimester: wnl RPR: Non Reactive (03/09 0952)  Rhogam N/A HBsAg:    Uterine leiomyoma 12/10/2013    Patient Active Problem List   Diagnosis Date Noted   Status post repeat low transverse cesarean section 02/27/2021   Intrauterine normal pregnancy 02/27/2021   Intrauterine pregnancy 02/27/2021   Syncope  02/16/2021   BMI 40.0-44.9, adult ([place]) 02/02/2021   History of postpartum depression, currently pregnant in second trimester 10/16/2020   Obesity in pregnancy, antepartum 09/01/2020   History of myomectomy 04/30/2019   Pelvic adhesive disease 04/26/2019   Supervision of high risk pregnancy, antepartum 11/03/2018   Uterine fibroid during pregnancy, antepartum 11/03/2018    Past Surgical History:  Procedure Laterality Date   CESAREAN SECTION N/A 05/31/2019   Procedure: CESAREAN SECTION;  Surgeon: Mora Bellman, MD;  Location: [facility] LD ORS;  Service: Obstetrics;  Laterality: N/A;   CESAREAN SECTION N/A 02/27/2021   Procedure: CESAREAN SECTION;  Surgeon: Woodroe Mode, MD;  Location: [facility] LD ORS;  Service: Obstetrics;  Laterality: N/A;   CYSTECTOMY     MYOMECTOMY  12/2018   [place] Dr. [name]. Sounds robotic assisted. pt states was told okay to labor   REMOVAL OF IMPLANON ROD  01/31/2020   surgery on left ear     bone removal    OB History     Gravida  3   Para  2   Term  2   Preterm      AB  1   Living  2      SAB      IAB      Ectopic  1   Multiple  0   Live Births  2        Obstetric Comments  G1: ectopic pregnancy (tx with methotrexate)          Home Medications    Prior to Admission medications   Medication Sig Start Date End Date Taking? Authorizing Provider  sulfamethoxazole-trimethoprim (BACTRIM DS) 800-160 MG tablet Take 1 tablet by mouth 2 (two) times daily for 7 days. 04/08/22 04/15/22 Yes Sharion Balloon, NP  ibuprofen (ADVIL) 600 MG tablet Take 1 tablet (600 mg total) by mouth every 6 (six) hours. 03/02/21   Truett Mainland, DO  oxyCODONE (OXY IR/ROXICODONE) 5 MG immediate release tablet Take 1-2 tablets (5-10 mg total) by mouth every 4 (four) hours as needed for moderate pain. 03/02/21   Truett Mainland, DO  sertraline (ZOLOFT) 25 MG tablet Take 1 tablet (25 mg total) by mouth daily. 11/14/21   Patrecia Pour, NP    Family  History Family History  Problem Relation Age of Onset   Breast cancer Maternal Grandmother    Diabetes Maternal Grandfather    Hypertension Paternal Grandfather    Breast cancer Paternal Aunt    Breast cancer Paternal Aunt     Social History Social History   Tobacco Use   Smoking status: Never   Smokeless tobacco: Never  Vaping Use   Vaping Use: Former   Substances: Flavoring  Substance Use Topics   Alcohol use: Not Currently    Comment: occasionally    Drug use: No     Allergies   Penicillins   Review of Systems Review of Systems  Constitutional:  Negative for chills and fever.  Skin:  Positive for wound. Negative for color change.  Neurological:  Negative for weakness and numbness.  All other systems reviewed and are negative.    Physical Exam Triage Vital Signs ED Triage Vitals  Enc Vitals Group     BP      Pulse      Resp      Temp      Temp src      SpO2      Weight      Height      Head Circumference      Peak Flow      Pain Score      Pain Loc      Pain Edu?      Excl. in [place]?    No data found.  Updated Vital Signs BP 119/79   Pulse 88   Temp 98.4 F (36.9 C)   Resp 18   Ht '5\' 6"'$  (1.676 m)   Wt 202 lb (91.6 kg)   LMP 04/07/2022   SpO2 98%   BMI 32.60 kg/m   Visual Acuity Right Eye Distance:   Left Eye Distance:   Bilateral Distance:    Right Eye Near:   Left Eye Near:    Bilateral Near:     Physical Exam Vitals and nursing note reviewed.  Constitutional:      General: She is not in acute distress.    Appearance: Normal appearance. She is well-developed. She is not ill-appearing.  HENT:     Mouth/Throat:     Mouth: Mucous membranes are moist.  Cardiovascular:     Rate and Rhythm: Normal rate and regular rhythm.     Heart sounds: Normal heart sounds.  Pulmonary:     Effort: Pulmonary effort is normal. No respiratory distress.     Breath sounds: Normal breath sounds.  Musculoskeletal:  General: Swelling and  tenderness present. No deformity. Normal range of motion.     Cervical back: Neck supple.  Skin:    General: Skin is warm and dry.     Capillary Refill: Capillary refill takes less than 2 seconds.     Findings: Lesion present.     Comments: Puncture wound on right wrist; no drainage; mild localized erythema.  Right hand mildly edematous.  FROM, sensation intact, 2+ pulse, strength 5/5.    Neurological:     General: No focal deficit present.     Mental Status: She is alert and oriented to person, place, and time.     Sensory: No sensory deficit.     Motor: No weakness.  Psychiatric:        Mood and Affect: Mood normal.        Behavior: Behavior normal.      UC Treatments / Results  Labs (all labs ordered are listed, but only abnormal results are displayed) Labs Reviewed - No data to display  EKG   Radiology No results found.  Procedures Procedures (including critical care time)  Medications Ordered in UC Medications - No data to display  Initial Impression / Assessment and Plan / UC Course  I have reviewed the triage vital signs and the nursing notes.  Pertinent labs & imaging results that were available during my care of the patient were reviewed by me and considered in my medical decision making (see chart for details).   Puncture wound of right wrist and cellulitis of right hand.  Tetanus up-to-date.  Treating with Bactrim.  Wound care instructions and signs of worsening infection discussed with patient.  Education provided on puncture wounds.  ED precautions discussed.  Patient does not currently have a PCP to follow-up with and she declines assistance with establishing a PCP as she does not currently have health insurance.  She agrees to plan of care.   Final Clinical Impressions(s) / UC Diagnoses   Final diagnoses:  Cellulitis of right hand  Puncture wound of right wrist, initial encounter     Discharge Instructions      Keep your wound clean and dry.  Wash  it gently twice a day with soap and water.  Apply an antibiotic cream twice a day.    Take the Bactrim as directed.   Go to the emergency department if you see signs of worsening infection, such as redness, pus-like drainage, warmth, fever, chills, or other concerning symptoms.        ED Prescriptions     Medication Sig Dispense Auth. Provider   sulfamethoxazole-trimethoprim (BACTRIM DS) 800-160 MG tablet Take 1 tablet by mouth 2 (two) times daily for 7 days. 14 tablet Sharion Balloon, NP      PDMP not reviewed this encounter.   Sharion Balloon, NP 04/08/22 [phone]

## 2022-04-08 NOTE — ED Triage Notes (Addendum)
Patient to Urgent Care with complaints of a wound present to her right wrist. Reports injury occurred yesterday, reports she was making dinner , her children were running around the kitchen and she accidentally stabbed herself in the wrist with a knife.   Reports area bled a lot but bleeding stopped with a bandaid. Patient woke up this morning with hand swelling and tenderness.
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
